# Patient Record
Sex: Female | Born: 1960 | Race: White | Hispanic: No | Marital: Married | Smoking: Current every day smoker
Health system: Southern US, Community
[De-identification: ages and names within clinical notes are randomized; demographics above are authoritative.]

## PROBLEM LIST (undated history)

## (undated) ENCOUNTER — Emergency Department (HOSPITAL_COMMUNITY): Payer: Self-pay | Source: Home / Self Care

## (undated) DIAGNOSIS — M1990 Unspecified osteoarthritis, unspecified site: Secondary | ICD-10-CM

## (undated) DIAGNOSIS — E785 Hyperlipidemia, unspecified: Secondary | ICD-10-CM

## (undated) DIAGNOSIS — F419 Anxiety disorder, unspecified: Secondary | ICD-10-CM

## (undated) DIAGNOSIS — K219 Gastro-esophageal reflux disease without esophagitis: Secondary | ICD-10-CM

## (undated) DIAGNOSIS — R7303 Prediabetes: Secondary | ICD-10-CM

## (undated) DIAGNOSIS — Z09 Encounter for follow-up examination after completed treatment for conditions other than malignant neoplasm: Secondary | ICD-10-CM

## (undated) DIAGNOSIS — F329 Major depressive disorder, single episode, unspecified: Secondary | ICD-10-CM

## (undated) DIAGNOSIS — F32A Depression, unspecified: Secondary | ICD-10-CM

## (undated) DIAGNOSIS — M25569 Pain in unspecified knee: Secondary | ICD-10-CM

## (undated) HISTORY — PX: ANKLE SURGERY: SHX546

## (undated) HISTORY — PX: KNEE SURGERY: SHX244

## (undated) HISTORY — PX: FRACTURE SURGERY: SHX138

## (undated) HISTORY — DX: Unspecified osteoarthritis, unspecified site: M19.90

## (undated) HISTORY — PX: TONSILLECTOMY: SUR1361

## (undated) HISTORY — DX: Hyperlipidemia, unspecified: E78.5

## (undated) HISTORY — DX: Gastro-esophageal reflux disease without esophagitis: K21.9

---

## 1998-02-16 ENCOUNTER — Emergency Department (HOSPITAL_COMMUNITY): Admission: EM | Admit: 1998-02-16 | Discharge: 1998-02-16 | Payer: Self-pay | Admitting: Emergency Medicine

## 1998-02-16 ENCOUNTER — Encounter: Payer: Self-pay | Admitting: Emergency Medicine

## 1998-06-22 ENCOUNTER — Ambulatory Visit (HOSPITAL_COMMUNITY): Admission: RE | Admit: 1998-06-22 | Discharge: 1998-06-22 | Payer: Self-pay

## 1998-06-28 ENCOUNTER — Ambulatory Visit (HOSPITAL_COMMUNITY): Admission: RE | Admit: 1998-06-28 | Discharge: 1998-06-28 | Payer: Self-pay

## 1998-08-30 ENCOUNTER — Encounter: Admission: RE | Admit: 1998-08-30 | Discharge: 1998-08-30 | Payer: Self-pay | Admitting: Obstetrics & Gynecology

## 1998-09-05 ENCOUNTER — Ambulatory Visit (HOSPITAL_COMMUNITY): Admission: RE | Admit: 1998-09-05 | Discharge: 1998-09-05 | Payer: Self-pay | Admitting: Obstetrics & Gynecology

## 1998-09-05 ENCOUNTER — Encounter: Payer: Self-pay | Admitting: Obstetrics & Gynecology

## 2000-01-03 ENCOUNTER — Other Ambulatory Visit: Admission: RE | Admit: 2000-01-03 | Discharge: 2000-01-03 | Payer: Self-pay | Admitting: *Deleted

## 2000-04-04 ENCOUNTER — Other Ambulatory Visit: Admission: RE | Admit: 2000-04-04 | Discharge: 2000-04-04 | Payer: Self-pay | Admitting: *Deleted

## 2000-06-20 ENCOUNTER — Other Ambulatory Visit: Admission: RE | Admit: 2000-06-20 | Discharge: 2000-06-20 | Payer: Self-pay | Admitting: *Deleted

## 2000-06-21 ENCOUNTER — Other Ambulatory Visit: Admission: RE | Admit: 2000-06-21 | Discharge: 2000-06-21 | Payer: Self-pay | Admitting: *Deleted

## 2000-06-21 ENCOUNTER — Encounter (INDEPENDENT_AMBULATORY_CARE_PROVIDER_SITE_OTHER): Payer: Self-pay | Admitting: Specialist

## 2000-09-10 ENCOUNTER — Other Ambulatory Visit: Admission: RE | Admit: 2000-09-10 | Discharge: 2000-09-10 | Payer: Self-pay | Admitting: *Deleted

## 2001-08-17 ENCOUNTER — Emergency Department (HOSPITAL_COMMUNITY): Admission: EM | Admit: 2001-08-17 | Discharge: 2001-08-17 | Payer: Self-pay | Admitting: Emergency Medicine

## 2001-09-19 ENCOUNTER — Emergency Department (HOSPITAL_COMMUNITY): Admission: EM | Admit: 2001-09-19 | Discharge: 2001-09-19 | Payer: Self-pay | Admitting: Emergency Medicine

## 2001-09-21 ENCOUNTER — Emergency Department (HOSPITAL_COMMUNITY): Admission: EM | Admit: 2001-09-21 | Discharge: 2001-09-21 | Payer: Self-pay

## 2002-09-19 ENCOUNTER — Encounter: Payer: Self-pay | Admitting: Emergency Medicine

## 2002-09-19 ENCOUNTER — Emergency Department (HOSPITAL_COMMUNITY): Admission: EM | Admit: 2002-09-19 | Discharge: 2002-09-19 | Payer: Self-pay | Admitting: Emergency Medicine

## 2002-12-09 ENCOUNTER — Encounter: Admission: RE | Admit: 2002-12-09 | Discharge: 2002-12-09 | Payer: Self-pay | Admitting: Internal Medicine

## 2003-04-29 ENCOUNTER — Inpatient Hospital Stay (HOSPITAL_COMMUNITY): Admission: EM | Admit: 2003-04-29 | Discharge: 2003-05-03 | Payer: Self-pay | Admitting: Emergency Medicine

## 2004-08-21 ENCOUNTER — Ambulatory Visit: Payer: Self-pay | Admitting: Nurse Practitioner

## 2005-01-18 ENCOUNTER — Ambulatory Visit: Payer: Self-pay | Admitting: Family Medicine

## 2005-01-20 ENCOUNTER — Ambulatory Visit (HOSPITAL_COMMUNITY): Admission: RE | Admit: 2005-01-20 | Discharge: 2005-01-20 | Payer: Self-pay | Admitting: Internal Medicine

## 2005-02-07 ENCOUNTER — Ambulatory Visit: Payer: Self-pay | Admitting: Nurse Practitioner

## 2005-02-10 ENCOUNTER — Ambulatory Visit (HOSPITAL_COMMUNITY): Admission: RE | Admit: 2005-02-10 | Discharge: 2005-02-10 | Payer: Self-pay | Admitting: Internal Medicine

## 2005-05-04 ENCOUNTER — Ambulatory Visit: Payer: Self-pay | Admitting: Nurse Practitioner

## 2005-07-26 ENCOUNTER — Ambulatory Visit: Payer: Self-pay | Admitting: Nurse Practitioner

## 2005-07-27 ENCOUNTER — Ambulatory Visit: Payer: Self-pay | Admitting: *Deleted

## 2005-10-18 ENCOUNTER — Ambulatory Visit: Payer: Self-pay | Admitting: Nurse Practitioner

## 2006-01-10 ENCOUNTER — Ambulatory Visit: Payer: Self-pay | Admitting: Internal Medicine

## 2006-03-18 ENCOUNTER — Ambulatory Visit: Payer: Self-pay | Admitting: Nurse Practitioner

## 2006-04-08 ENCOUNTER — Ambulatory Visit: Payer: Self-pay | Admitting: Nurse Practitioner

## 2006-09-26 ENCOUNTER — Ambulatory Visit: Payer: Self-pay | Admitting: Family Medicine

## 2006-09-26 ENCOUNTER — Encounter (INDEPENDENT_AMBULATORY_CARE_PROVIDER_SITE_OTHER): Payer: Self-pay | Admitting: Nurse Practitioner

## 2006-09-26 LAB — CONVERTED CEMR LAB
AST: 22 units/L (ref 0–37)
BUN: 13 mg/dL (ref 6–23)
Chloride: 103 meq/L (ref 96–112)
Glucose, Bld: 95 mg/dL (ref 70–99)
Hemoglobin: 14.9 g/dL (ref 12.0–15.0)
Lymphs Abs: 3 10*3/uL (ref 0.7–3.3)
Monocytes Relative: 10 % (ref 3–11)
Neutro Abs: 5 10*3/uL (ref 1.7–7.7)
Neutrophils Relative %: 55 % (ref 43–77)
Potassium: 4.3 meq/L (ref 3.5–5.3)
Sodium: 137 meq/L (ref 135–145)
TSH: 1.006 microintl units/mL (ref 0.350–5.50)
Total Protein: 7.6 g/dL (ref 6.0–8.3)

## 2006-10-02 ENCOUNTER — Encounter (INDEPENDENT_AMBULATORY_CARE_PROVIDER_SITE_OTHER): Payer: Self-pay | Admitting: *Deleted

## 2006-10-24 ENCOUNTER — Ambulatory Visit (HOSPITAL_COMMUNITY): Admission: RE | Admit: 2006-10-24 | Discharge: 2006-10-24 | Payer: Self-pay | Admitting: Family Medicine

## 2006-12-19 ENCOUNTER — Ambulatory Visit: Payer: Self-pay | Admitting: Family Medicine

## 2007-03-13 ENCOUNTER — Ambulatory Visit: Payer: Self-pay | Admitting: Internal Medicine

## 2007-06-04 ENCOUNTER — Ambulatory Visit: Payer: Self-pay | Admitting: Internal Medicine

## 2007-07-26 ENCOUNTER — Emergency Department (HOSPITAL_BASED_OUTPATIENT_CLINIC_OR_DEPARTMENT_OTHER): Admission: EM | Admit: 2007-07-26 | Discharge: 2007-07-26 | Payer: Self-pay | Admitting: Emergency Medicine

## 2007-08-06 ENCOUNTER — Inpatient Hospital Stay (HOSPITAL_COMMUNITY): Admission: RE | Admit: 2007-08-06 | Discharge: 2007-08-08 | Payer: Self-pay | Admitting: Orthopedic Surgery

## 2007-08-11 ENCOUNTER — Ambulatory Visit: Payer: Self-pay | Admitting: Internal Medicine

## 2007-08-11 LAB — CONVERTED CEMR LAB
INR: 2 — ABNORMAL HIGH (ref 0.0–1.5)
Prothrombin Time: 23.6 s — ABNORMAL HIGH (ref 11.6–15.2)

## 2007-08-14 ENCOUNTER — Ambulatory Visit: Payer: Self-pay | Admitting: Internal Medicine

## 2007-08-14 LAB — CONVERTED CEMR LAB
INR: 3.3 — ABNORMAL HIGH (ref 0.0–1.5)
Prothrombin Time: 36.4 s — ABNORMAL HIGH (ref 11.6–15.2)

## 2007-08-18 ENCOUNTER — Ambulatory Visit: Payer: Self-pay | Admitting: Internal Medicine

## 2007-08-18 LAB — CONVERTED CEMR LAB: INR: 4.6 — ABNORMAL HIGH (ref 0.0–1.5)

## 2007-08-21 ENCOUNTER — Ambulatory Visit: Payer: Self-pay | Admitting: Internal Medicine

## 2007-08-26 ENCOUNTER — Ambulatory Visit: Payer: Self-pay | Admitting: Internal Medicine

## 2010-05-17 ENCOUNTER — Emergency Department (HOSPITAL_COMMUNITY): Payer: Self-pay

## 2010-05-17 ENCOUNTER — Emergency Department (HOSPITAL_COMMUNITY)
Admission: EM | Admit: 2010-05-17 | Discharge: 2010-05-17 | Disposition: A | Discharge status: Home / Self Care | Payer: Self-pay | Attending: Emergency Medicine | Admitting: Emergency Medicine

## 2010-05-17 DIAGNOSIS — M25569 Pain in unspecified knee: Secondary | ICD-10-CM | POA: Insufficient documentation

## 2010-05-30 NOTE — Op Note (Signed)
NAME:  [name], [name] NO.:  [id number]   MEDICAL RECORD NO.:  [id number]          PATIENT TYPE:  OIB   LOCATION:  1314                         FACILITY:  [facility]   PHYSICIAN:  Ollen Gross, M.D.    DATE OF BIRTH:  11/08/1960   DATE OF PROCEDURE:  08/06/2007  DATE OF DISCHARGE:                               OPERATIVE REPORT   PREOPERATIVE DIAGNOSIS:  Right bimalleolar ankle fracture.   POSTOPERATIVE DIAGNOSIS:  Right bimalleolar ankle fracture.   PROCEDURE:  Open reduction internal fixation right ankle fracture.   SURGEON:  Ollen Gross, M.D.   ASSISTANT:  Avel Peace, P.A.-C.   ANESTHESIA:  General.   ESTIMATED BLOOD LOSS:  Minimal.   DRAINS:  None.   TOURNIQUET TIME:  44 minutes at 300 mmHg.   COMPLICATIONS:  None.   CONDITION:  Stable to recovery.   BRIEF CLINICAL NOTE:  Jasmine Santos is a 50 year old female who had a bimalleolar  ankle fracture sustained approximately a week ago.  She had some slight  displacement of both the medial and lateral malleoli.  She presents now  for open reduction and internal fixation.   PROCEDURE IN DETAIL:  After the successful administration of general  anesthetic, a tourniquet was placed high on the right thigh and right  lower extremity prepped and draped in the usual sterile fashion.  Extremities wrapped in Esmarch.  Tourniquet inflated to 300 mmHg.  Lateral incision was made first over the fibula.  Skin was cut with a 10  blade through subcutaneous tissue to the periosteum.  Fracture was  identified and anatomically reduced and held with the fracture induction  clamp.  Seven-hole 1/3 tubular plate was placed.  I placed 1 screw  cancellus distal to the fracture, 1 cortical to the fracture, and then  released the clamp.  The fracture stayed reduced.  We placed 2 more  cancellus screws distal to the fracture, 2 more cortical screws  proximal.  The top hole was not filled as it was partially anterior to  the bone, but  the rest of screws had excellent purchase.  The  syndesmosis was tested under fluoroscopy and found to be intact.  I made  a medial vertical incision over the medial malleolus.  The fracture line  was identified and reduced.  The guide pins for the 4.0 cannulated  cancellous screws were then passed from the tip of malleolus up into the  tibia.  The medial malleolus appears anatomically reduced.  Two 50-mm  cancellus cannulated screws were then passed over the guide pins, and  excellent purchase was achieved in the bone.  The guide pins were then  removed.  We then copiously irrigated both incisions with saline.  I  repaired the retinaculum  medially.  The subcutaneous was then closed with interrupted 2-0 Vicryl  and skin with staples on both incisions.  A bulky sterile dressing was  applied, and she was placed into a Cam walker, awakened and transferred  to recovery in stable condition.      Ollen Gross, M.D.  Electronically Signed     FA/MEDQ  D:  08/06/2007  T:  08/06/2007  Job:  [id number]

## 2010-06-02 NOTE — Group Therapy Note (Signed)
NAME:  Jasmine Santos, Jasmine Santos NO.:  [id number]   MEDICAL RECORD NO.:  [id number]                   PATIENT TYPE:  INP   LOCATION:  IC07                                 FACILITY:  [facility]   PHYSICIAN:  Vania Rea, M.D.              DATE OF BIRTH:  1960-12-17   DATE OF PROCEDURE:  05/01/2003  DATE OF DISCHARGE:                                   PROGRESS NOTE   SUBJECTIVE:  A 50 year old lady admitted with overdose of between 40 to 90  tablets of Effexor, admitted with a long QT on the EKG with recurrent  seizure and found to have cocaine urine and alcohol level of over 140 many  hours after admission.  This morning the patient is confused about where she  is and why she is here.  She was very agitated last night, and she continues  to receive Ativan around the clock.  Her p.o. intake is described as  minimal, but she is eating.   She has been evaluated by the ACT team.  I have signed her committal papers  and once she is medically stable, she will be transferred to a behavioral  health facility.   OBJECTIVE:  VITAL SIGNS:  Her vitals this morning:  Temperature 97.5, pulse  81, respirations 21, and blood pressure 110/70.  She is saturating at 98%.  HEENT:  Pupils are round and equal and reactive to light.  CHEST:  She has occasional crackles in the bases bilaterally.  CARDIOVASCULAR:  Regular rhythm, no gallops, no murmurs.  ABDOMEN:  Obese, soft, and nontender.  She has normal abdominal bowel  sounds.  EXTREMITIES:  No edema.  Scars of self-mutilation are noticed.   Her labs this morning:  Her chemistry is essentially normal except for a  glucose of 102.  Her BUN is 3 and her creatinine is 0.6.  Calcium is 8.9.  The potassium is 4.2.  Her white count is 11.1, which is down from 17.  She  remains without antibiotics.  Hematocrit is 39.6, RDW 13, and platelet count  260, 70% neutrophils.  Absolute neutrophil count is 7.8, which is just  normal.  Phosphorus  and magnesium are normal.   Her chest x-ray shows mild pulmonary edema.  She is receiving IV fluids at  200 mL/hr. Still.   ASSESSMENT:  1. Selective serotonin reuptake inhibitor overdose.  2. Severe depression.  3. History of bipolar disorder.  4. Ethanol abuse.  5. Tobacco abuse.  6. Fluid overload.  7. Confusional state.  8. History of recurrent seizure secondary to drug overdose.   PLAN:  1. We will heparin-lock her IV.  2. Will continue on Ativan and start weaning when signs of agitation are     diminished.  3. Although her QT on her EKG is now normalized and she does have a history     of depression, we will not  be giving any psychotropics at this point.  4. She will otherwise receive supportive care.  5. She may, in fact, be going through alcohol withdrawal with his new     agitation __________.      ___________________________________________                                            Vania Rea, M.D.   LC/MEDQ  D:  05/01/2003  T:  05/01/2003  Job:  [id number]

## 2010-06-02 NOTE — H&P (Signed)
NAME:  Jasmine Santos, Jasmine Santos NO.:  [id number]   MEDICAL RECORD NO.:  [id number]                   PATIENT TYPE:  INP   LOCATION:  IC07                                 FACILITY:  [facility]   PHYSICIAN:  Vania Rea, M.D.              DATE OF BIRTH:  10-18-1960   DATE OF ADMISSION:  04/29/2003  DATE OF DISCHARGE:                                HISTORY & PHYSICAL   PRIMARY CARE PHYSICIAN:  Unassigned.   CHIEF COMPLAINT:  Recurrent seizures after Effexor overdose.   HISTORY OF PRESENT ILLNESS:  This lady is unable to give a clear history  because of what has transpired in the past 24 hours.   This is a 50 year old Caucasian lady who recently moved into the [place]  area from [place]. She says for reasons which are unclear she took four  to five tablets of Effexor yesterday. There were 90 tablets in the bottle,  she says. She just decided to take 45. She occasionally takes Tylenol PM to  help her to sleep, but she has not taken any for the past two weeks she  said. The patient also abuses alcohol, drinks six pack daily for many years,  and smokes tobacco one pack per day for many years. Does not admit illicit  drug use. After taking these tablets, the patient later had seizures at  home, and her live-in boyfriend brought her to the emergency room. In the  emergency room, the patient has had two further seizures but is not alert  although somewhat confused.   PAST MEDICAL HISTORY:  1. Depression.  2. ETOH abuse.  3. Tobacco abuse.   MEDICATIONS:  1. Effexor dose unknown.  2. Tylenol PM occasionally for insomnia.   ALLERGIES:  No known drug allergies.   SOCIAL HISTORY:  The patient says she works in an [employer] development [employer],  unclear where. She waivers in her answers. She does admit to chronic tobacco  and alcohol abuse. She has never been married. Has no children. Lives with  her boyfriend.   FAMILY HISTORY:  Unable to get a clear and reliable  family history from her.   REVIEW OF SYSTEMS:  Denies headache currently but since taking the Effexor  says she has been having blurred vision. Denies chest pain, palpitations, or  shortness of breath. Denies nausea, vomiting, or abdominal pain. Denies  frequency, dysuria, hematuria. Denies any musculoskeletal problems. Denies  any past history of strokes. Denies any past history of hospital admissions.   PHYSICAL EXAMINATION:  GENERAL:  This is a young Caucasian lady sitting up  on the stretcher, looking somewhat confused, complaining of thirst. She has  an obvious abrasion on the right side of her forehead as a result of trauma  during her seizures.  VITAL SIGNS:  Temperature is not available. Blood pressure is 154/40, pulse  92, respirations 20. She is saturating 95% on room air.  HEENT:  Pupils are equal, widely dilated but reactive to light. She is pink  and anicteric. There is no jugular venous distention. No lymphadenopathy. No  thyromegaly.  CHEST:  Clear to auscultation bilaterally.  CARDIOVASCULAR:  Regular rhythm, no murmurs.  ABDOMEN:  Soft and nontender.  EXTREMITIES:  There is no edema. There are many old bruises. Dorsalis pedis  pulses are 3+ bilaterally.  CENTRAL NERVOUS SYSTEM:  There is no obvious neurological deficit. She is  alert and oriented to herself and her location.   LABORATORY DATA:  White count is 12.8 with a hematocrit of 40.7 and  neutrophil count of 74 and a platelet count of 327. Serum sodium is entirely  normal. Potassium is 3.5, creatinine 0.8. Her liver function tests are  completely normal. Albumin is 4.3, total bilirubin 0.5, alkaline phosphatase  is 56. Tylenol level undetectable. Salicylate levels are undetectable. Urine  pregnancy test is negative. Urinalysis shows a specific gravity of 1.030 and  a trace of ketones, otherwise unremarkable. Urine drug screen is positive  for cocaine, negative for all the other tested substances. Her alcohol  level  at 11:15 a.m. is 143 ____________ much higher last night when she was  overdosing herself with these medications.   ASSESSMENT:  1. Effexor overdose with sinus tachycardia and long QT on EKG.  2. Seizure disorder, probably secondary to Effexor overdose, possibly     related to ETOH intoxication and possibly a combination of both.  3. Dehydration.   PLAN:  Will admit to the intensive care unit. Will hydrate her. Will monitor  her serum chemistry. Will put on her an Ativan withdraw protocol which will  treat both her seizures and will prevent any ETOH withdraw. Will monitor EKG  once she is stable. Get an ACT team consult to assess her mental status.  Will get her records from [facility] in [place] where she is usually  treated.     ___________________________________________                                         Vania Rea, M.D.   LC/MEDQ  D:  04/29/2003  T:  04/29/2003  Job:  [id number]

## 2010-06-02 NOTE — Discharge Summary (Signed)
NAME:  Jasmine Santos, Jasmine Santos NO.:  [id number]   MEDICAL RECORD NO.:  [id number]                   PATIENT TYPE:  INP   LOCATION:  IC07                                 FACILITY:  [facility]   PHYSICIAN:  Vania Rea, M.D.              DATE OF BIRTH:  [birth year]   DATE OF ADMISSION:  04/29/2003  DATE OF DISCHARGE:  05/03/2003                                 DISCHARGE SUMMARY   PRIMARY CARE PHYSICIAN:  Unassigned.  Gets care from [facility].   DISCHARGE DIAGNOSES:  1. Severe depression.  2. Effexor overdose.  3. Alcohol intoxication.  4. Alcohol withdrawal, resolved.  5. Tobacco abuse.  6. Status epilepticus, status second __________ one and two.   CONDITION ON DISCHARGE:  Stable.   DISPOSITION:  The patient is planned for discharge to [facility]  facility.   DISCHARGE MEDICATIONS:  1. Ativan 4 mg p.o. four times daily, tapering over four to five days.  2. Multivitamins, one tablet daily.  3. Thiamine 100 mg daily.  4. Folic acid 1 mg daily.   HOSPITAL COURSE:  Please refer to admission history and physical of April  14th.  The patient is a 50 year old Caucasian lady with history of cocaine,  tobacco, and alcohol abuse who in a drunken stupor in a state of depression  overdosed on about 88 Effexor tablets, 37.5 mg strength.  She had a seizure  at home and was brought into the emergency room.  She had recurrent seizures  in the emergency room.  She was loaded with Ativan and admitted to the  intensive care.  The patient was noted to have long QT while on the monitor.  She was confused and hallucinogenic for about three days while in the  intensive care unit but now is pretty close to her baseline and stable.  She  had no derangements of her liver functions.  Her serum chemistry and  hematology remain normal.  She was aggressively hydrated from the time of  admission and did develop fluid overload which spontaneously corrected when  her IV  fluids were discontinued two days ago.  The patient required wrist  restraints and Ativan 4 mg IV every two hours for the first two days of  admission in order to suppress seizures and to suppress her agitation.  Gradually, the frequency of intravenous Ativan has been reduced.   The patient also has a history of self-mutilation according to her family,  and there are multiple linear abrasions on her feet suggestive of knife  marks.   LABORATORY DATA:  Today, her white count is 8.9, hemoglobin 14.9, platelets  300.  Her phosphorous is 3.5, and her magnesium is 2.0.  Her serum chemistry  done yesterday was completely normal.  The LFTs done yesterday were  completely normal.  On admission, her serum alcohol done about 12 hours  after arrival was 143.  Her salicylate  and acetaminophen were negative.  Urine toxicity screen was positive for cocaine.  Pregnancy test was  negative.   FOLLOW UP:  The patient is to be followed up by the psychiatrist at the  mental health facility.     ___________________________________________                                         Vania Rea, M.D.   LC/MEDQ  D:  05/03/2003  T:  05/03/2003  Job:  [id number]

## 2010-06-02 NOTE — Group Therapy Note (Signed)
NAME:  Jasmine Santos, BUTTERMORE NO.:  [id number]   MEDICAL RECORD NO.:  [id number]                   PATIENT TYPE:  INP   LOCATION:  IC07                                 FACILITY:  [facility]   PHYSICIAN:  Vania Rea, M.D.              DATE OF BIRTH:  [birth year]   DATE OF PROCEDURE:  DATE OF DISCHARGE:                                   PROGRESS NOTE   COMMENT:  A 50 year old lady admitted after overdosing on 88 tablets of  Effexor 37.5 mg each, also had cocaine on board.  Also had alcohol on board.  This is her third hospital day.  The patient has been through periods of  severe agitation and hallucination.  Last night, she was very agitated and  has not gotten much sleep thi morning.  She has required less Ativan and is  more lucid, but is still somewhat confused, but is being continuously  oriented.  We are now seeing signs of progress with her, and she may only be  here maybe another 24 hours.   SUBJECTIVE:  No complaints.   OBJECTIVE:  Temperature 97.2, pulse 100, respirations 21, blood pressure  112/87.  Saturating at 91%.  Her chest is clear to auscultation bilaterally  with no evidence of fluid overload.  Abdomen is soft and nontender.  Her  extremities, she has no edema.  She has 3+ pulses.  Lab work:  Liver  function tests are completely normal, and I think we can now discontinue  these tests if phosphorus is normal.  Magnesium is normal.  Her serum  chemistry:  Sodium 136, potassium 3.8, chloride 104, cO2 23, glucose 113,  BUN 7, creatinine 0.7, and calcium 9.3.  Her CBC:  White count 11.6 which is  still trending down.  Neutrophil count is 6.7.  Absolute count is 7.8 which  is unchanged from yesterday, and this is borderline normal.  We will call  this normal.   ASSESSMENT/PLAN:  Acute drug overdose with SRI's concomitant with alcohol  intoxication and cocaine abuse.  Recurrent seizures, status epilepticus on  admission.  No seizure since arrival  in the intensive care unit.  The  patient maybe sufficiency stable for transfer to the Behavior Health  Facility in the morning.      ___________________________________________                                            Vania Rea, M.D.   LC/MEDQ  D:  05/02/2003  T:  05/02/2003  Job:  [id number]

## 2010-07-20 ENCOUNTER — Other Ambulatory Visit: Payer: Self-pay | Admitting: Obstetrics & Gynecology

## 2010-07-20 ENCOUNTER — Other Ambulatory Visit (HOSPITAL_COMMUNITY)
Admission: RE | Admit: 2010-07-20 | Discharge: 2010-07-20 | Disposition: A | Discharge status: Home / Self Care | Payer: Medicaid Other | Source: Ambulatory Visit | Attending: Obstetrics & Gynecology | Admitting: Obstetrics & Gynecology

## 2010-07-20 DIAGNOSIS — Z113 Encounter for screening for infections with a predominantly sexual mode of transmission: Secondary | ICD-10-CM | POA: Insufficient documentation

## 2010-07-20 DIAGNOSIS — Z01419 Encounter for gynecological examination (general) (routine) without abnormal findings: Secondary | ICD-10-CM | POA: Insufficient documentation

## 2010-10-13 LAB — URINALYSIS, ROUTINE W REFLEX MICROSCOPIC
Bilirubin Urine: NEGATIVE
Glucose, UA: NEGATIVE
Hgb urine dipstick: NEGATIVE
Ketones, ur: NEGATIVE
Nitrite: NEGATIVE
Specific Gravity, Urine: 1.025
pH: 5

## 2010-10-13 LAB — BASIC METABOLIC PANEL
BUN: 11
CO2: 27
Calcium: 9.7
Glucose, Bld: 105 — ABNORMAL HIGH
Sodium: 138

## 2010-10-13 LAB — URINE MICROSCOPIC-ADD ON

## 2010-10-13 LAB — CBC
HCT: 42
MCHC: 34.4
MCV: 93.7
RBC: 4.48
WBC: 8.9

## 2010-10-13 LAB — PROTIME-INR
INR: 1
Prothrombin Time: 13.8

## 2010-10-13 LAB — PREGNANCY, URINE: Preg Test, Ur: NEGATIVE

## 2010-11-24 ENCOUNTER — Emergency Department (HOSPITAL_COMMUNITY): Payer: Medicaid Other

## 2010-11-24 ENCOUNTER — Emergency Department (HOSPITAL_COMMUNITY)
Admission: EM | Admit: 2010-11-24 | Discharge: 2010-11-24 | Disposition: A | Discharge status: Home / Self Care | Payer: Medicaid Other | Attending: Emergency Medicine | Admitting: Emergency Medicine

## 2010-11-24 ENCOUNTER — Encounter: Payer: Self-pay | Admitting: *Deleted

## 2010-11-24 DIAGNOSIS — F17200 Nicotine dependence, unspecified, uncomplicated: Secondary | ICD-10-CM | POA: Insufficient documentation

## 2010-11-24 DIAGNOSIS — M25569 Pain in unspecified knee: Secondary | ICD-10-CM | POA: Insufficient documentation

## 2010-11-24 DIAGNOSIS — S83519A Sprain of anterior cruciate ligament of unspecified knee, initial encounter: Secondary | ICD-10-CM

## 2010-11-24 DIAGNOSIS — S83202A Bucket-handle tear of unspecified meniscus, current injury, unspecified knee, initial encounter: Secondary | ICD-10-CM

## 2010-11-24 HISTORY — DX: Major depressive disorder, single episode, unspecified: F32.9

## 2010-11-24 HISTORY — DX: Depression, unspecified: F32.A

## 2010-11-24 HISTORY — DX: Anxiety disorder, unspecified: F41.9

## 2010-11-24 NOTE — ED Notes (Signed)
Pt needed referral for MRI due to knee giving out. Old ACL injury to right knee also. Pain x 6 mo. Dr. Sanjuan Dame office told pt to come to ED for referral.

## 2010-11-24 NOTE — ED Notes (Signed)
Pt a/ox4. Resp even and unlabored. NAD at this time. D/C instructions reviewed with pt. Pt verbalized understanding. Pt to POV via w/c and walker. Pt stable.

## 2010-11-24 NOTE — ED Notes (Signed)
Knee immobilizer applied to right leg

## 2010-11-24 NOTE — ED Provider Notes (Signed)
History     CSN: [id number] Arrival date & time: 11/24/2010 12:37 PM   First MD Initiated Contact with Patient 11/24/10 1307      Chief Complaint  Patient presents with  . Knee Pain    (Consider location/radiation/quality/duration/timing/severity/associated sxs/prior treatment) HPI Comments: Pt states she had an ACL tear many years ago that has gone unrepaired.  More and more frequently she notes that with certain movements she has  Pain and the instability  Frequently causes her to fall down.  She says that she has talked to "betty  Here at [facility] that told her she will probably need an MRI that  [facility]  Could help with and the same with an orthopedic referral".  Patient is a 50 y.o. female presenting with knee pain. The history is provided by the patient. No language interpreter was used.  Knee Pain This is a chronic problem. The problem occurs constantly. The symptoms are aggravated by walking and standing. She has tried nothing for the symptoms.    Past Medical History  Diagnosis Date  . Anxiety   . Depression     Past Surgical History  Procedure Date  . Knee surgery   . Ankle surgery   . Tonsillectomy     No family history on file.  History  Substance Use Topics  . Smoking status: Current Everyday Smoker  . Smokeless tobacco: Not on file  . Alcohol Use: Yes     OCC    OB History    Grav Para Term Preterm Abortions TAB SAB Ect Mult Living                  Review of Systems  Musculoskeletal: Positive for gait problem.       Knee pain  All other systems reviewed and are negative.    Allergies  Review of patient's allergies indicates no known allergies.  Home Medications  No current outpatient prescriptions on file.  BP 128/93  Pulse 97  Temp(Src) 98.4 F (36.9 C) (Oral)  Resp 18  Ht 5\' 4"  (1.626 m)  Wt 170 lb (77.111 kg)  BMI 29.18 kg/m2  SpO2 98%  Physical Exam  Nursing note and vitals reviewed. Constitutional: She is oriented to person, place,  and time. She appears well-developed and well-nourished. No distress.  HENT:  Head: Normocephalic and atraumatic.  Eyes: EOM are normal.  Neck: Normal range of motion.  Cardiovascular: Normal rate, regular rhythm and normal heart sounds.   Pulmonary/Chest: Effort normal and breath sounds normal.  Abdominal: Soft. She exhibits no distension. There is no tenderness.  Musculoskeletal: She exhibits tenderness.       Right knee: She exhibits decreased range of motion. She exhibits no swelling, no effusion, no ecchymosis, no deformity, no laceration, no erythema, normal alignment, no LCL laxity and normal patellar mobility.       Legs: Neurological: She is alert and oriented to person, place, and time.  Skin: Skin is warm and dry.  Psychiatric: She has a normal mood and affect. Judgment normal.    ED Course  Procedures (including critical care time)  Labs Reviewed - No data to display No results found.   No diagnosis found.    MDM          Worthy Rancher, PA 11/26/10 [phone]

## 2010-11-27 NOTE — ED Provider Notes (Signed)
Medical screening examination/treatment/procedure(s) were performed by non-physician practitioner and as supervising physician I was immediately available for consultation/collaboration.  Dartanyon Frankowski S. Michille Mcelrath, MD 11/27/10 1755 

## 2011-09-24 ENCOUNTER — Emergency Department (HOSPITAL_COMMUNITY)
Admission: EM | Admit: 2011-09-24 | Discharge: 2011-09-24 | Disposition: A | Discharge status: Home / Self Care | Payer: BC Managed Care – PPO | Attending: Emergency Medicine | Admitting: Emergency Medicine

## 2011-09-24 ENCOUNTER — Inpatient Hospital Stay (HOSPITAL_COMMUNITY): Payer: BC Managed Care – PPO

## 2011-09-24 ENCOUNTER — Encounter (HOSPITAL_COMMUNITY): Payer: Self-pay | Admitting: *Deleted

## 2011-09-24 DIAGNOSIS — F17200 Nicotine dependence, unspecified, uncomplicated: Secondary | ICD-10-CM | POA: Insufficient documentation

## 2011-09-24 DIAGNOSIS — F411 Generalized anxiety disorder: Secondary | ICD-10-CM | POA: Insufficient documentation

## 2011-09-24 DIAGNOSIS — T5191XA Toxic effect of unspecified alcohol, accidental (unintentional), initial encounter: Secondary | ICD-10-CM | POA: Insufficient documentation

## 2011-09-24 DIAGNOSIS — T7411XA Adult physical abuse, confirmed, initial encounter: Secondary | ICD-10-CM | POA: Insufficient documentation

## 2011-09-24 DIAGNOSIS — T43694A Poisoning by other psychostimulants, undetermined, initial encounter: Secondary | ICD-10-CM | POA: Insufficient documentation

## 2011-09-24 DIAGNOSIS — T43502A Poisoning by unspecified antipsychotics and neuroleptics, intentional self-harm, initial encounter: Secondary | ICD-10-CM | POA: Insufficient documentation

## 2011-09-24 DIAGNOSIS — R4182 Altered mental status, unspecified: Secondary | ICD-10-CM | POA: Insufficient documentation

## 2011-09-24 DIAGNOSIS — F329 Major depressive disorder, single episode, unspecified: Secondary | ICD-10-CM | POA: Insufficient documentation

## 2011-09-24 DIAGNOSIS — F3289 Other specified depressive episodes: Secondary | ICD-10-CM | POA: Insufficient documentation

## 2011-09-24 DIAGNOSIS — Y92009 Unspecified place in unspecified non-institutional (private) residence as the place of occurrence of the external cause: Secondary | ICD-10-CM | POA: Insufficient documentation

## 2011-09-24 DIAGNOSIS — F1010 Alcohol abuse, uncomplicated: Secondary | ICD-10-CM

## 2011-09-24 DIAGNOSIS — IMO0002 Reserved for concepts with insufficient information to code with codable children: Secondary | ICD-10-CM | POA: Insufficient documentation

## 2011-09-24 DIAGNOSIS — T6592XA Toxic effect of unspecified substance, intentional self-harm, initial encounter: Secondary | ICD-10-CM | POA: Insufficient documentation

## 2011-09-24 DIAGNOSIS — T7492XA Unspecified child maltreatment, confirmed, initial encounter: Secondary | ICD-10-CM | POA: Insufficient documentation

## 2011-09-24 DIAGNOSIS — T43501A Poisoning by unspecified antipsychotics and neuroleptics, accidental (unintentional), initial encounter: Secondary | ICD-10-CM | POA: Insufficient documentation

## 2011-09-24 DIAGNOSIS — T7491XA Unspecified adult maltreatment, confirmed, initial encounter: Secondary | ICD-10-CM | POA: Insufficient documentation

## 2011-09-24 LAB — RAPID URINE DRUG SCREEN, HOSP PERFORMED
Amphetamines: NOT DETECTED
Benzodiazepines: NOT DETECTED
Opiates: NOT DETECTED

## 2011-09-24 LAB — CBC WITH DIFFERENTIAL/PLATELET
Basophils Absolute: 0.1 10*3/uL (ref 0.0–0.1)
HCT: 44.2 % (ref 36.0–46.0)
Hemoglobin: 15.8 g/dL — ABNORMAL HIGH (ref 12.0–15.0)
Lymphocytes Relative: 39 % (ref 12–46)
Monocytes Absolute: 0.6 10*3/uL (ref 0.1–1.0)
Neutro Abs: 4.7 10*3/uL (ref 1.7–7.7)
RBC: 4.81 MIL/uL (ref 3.87–5.11)
RDW: 12.2 % (ref 11.5–15.5)
WBC: 8.9 10*3/uL (ref 4.0–10.5)

## 2011-09-24 LAB — BASIC METABOLIC PANEL
CO2: 24 mEq/L (ref 19–32)
Chloride: 103 mEq/L (ref 96–112)
Creatinine, Ser: 0.89 mg/dL (ref 0.50–1.10)

## 2011-09-24 LAB — SALICYLATE LEVEL: Salicylate Lvl: <2 mg/dL — ABNORMAL LOW (ref 2.8–20.0)

## 2011-09-24 LAB — ETHANOL
Alcohol, Ethyl (B): 270 mg/dL — ABNORMAL HIGH (ref 0–11)
Alcohol, Ethyl (B): <11 mg/dL (ref 0–11)

## 2011-09-24 MED ORDER — SODIUM CHLORIDE 0.9 % IV BOLUS (SEPSIS)
1000.0000 mL | Freq: Once | INTRAVENOUS | Status: AC
  Administered 2011-09-24: 1000 mL via INTRAVENOUS

## 2011-09-24 MED ORDER — LORAZEPAM 1 MG PO TABS
1.0000 mg | ORAL_TABLET | Freq: Once | ORAL | Status: AC
  Administered 2011-09-24: 1 mg via ORAL
  Filled 2011-09-24: qty 1

## 2011-09-24 NOTE — ED Provider Notes (Signed)
Assumed care in signout Pt now more awake/alert Denies SI.  She reports she feels safe at home and wants to go home to her husband She was seen by telepsych.  telepsych feels she is safe for detox and otherwise does not need inpatient admission She refuses detox.  She will be seen by ACT and sent home.  CXR ordered for mild hypoxia that resolved. ekg reviewed.  Labs reviewed.  One dose of ativan ordered.  Pt is taking PO Vitals improved BP 118/84  Pulse 97  Temp 97.9 F (36.6 C) (Oral)  Resp 20  SpO2 97%   Joya Gaskins, MD 09/24/11 1914

## 2011-09-24 NOTE — ED Notes (Signed)
Spoke with Corrie Dandy at [facility] re: pt Seroquel overdose.  Advises EKG, cardiac monitoring, tylenol and salicylate levels, Mg++ level, K+; watch for hypotension, prolonged QRS and QT; advises do not give Geodon or Haldol for agitation.

## 2011-09-24 NOTE — ED Notes (Signed)
Placed on cardiac monitor, BP monitor and continuous pulse ox.

## 2011-09-24 NOTE — ED Notes (Signed)
Pt reporting she has spoken with ACT team, and had telepsych consult.  Pt to be discharged home per physician.  Denies any needs at present.  No distress noted.  Pt understands discharge instructions and need to follow up.

## 2011-09-24 NOTE — ED Notes (Signed)
RA SpO2 90%; placed on O2 2L/min per Nikolai; NS 1L bolus initiated.

## 2011-09-24 NOTE — ED Notes (Signed)
Meal tray given 

## 2011-09-24 NOTE — ED Notes (Signed)
Pt on consult with telepsych psychiatrist.

## 2011-09-24 NOTE — ED Notes (Addendum)
Wrong pt
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
## 2011-09-24 NOTE — ED Notes (Signed)
Pt arrived from home via ems d/t drug overdose. Pt reportedly took 11 Seroquel 100mg  and an unknown number of Aderol.
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
## 2011-09-26 ENCOUNTER — Ambulatory Visit: Payer: Medicaid Other | Admitting: Orthopedic Surgery

## 2011-10-16 ENCOUNTER — Ambulatory Visit (INDEPENDENT_AMBULATORY_CARE_PROVIDER_SITE_OTHER): Payer: BC Managed Care – PPO | Admitting: Orthopedic Surgery

## 2011-10-16 ENCOUNTER — Encounter: Payer: Self-pay | Admitting: Orthopedic Surgery

## 2011-10-16 VITALS — BP 120/72 | Ht 64.0 in | Wt 170.0 lb

## 2011-10-16 DIAGNOSIS — M238X9 Other internal derangements of unspecified knee: Secondary | ICD-10-CM

## 2011-10-16 DIAGNOSIS — M2390 Unspecified internal derangement of unspecified knee: Secondary | ICD-10-CM

## 2011-10-16 DIAGNOSIS — M23205 Derangement of unspecified medial meniscus due to old tear or injury, unspecified knee: Secondary | ICD-10-CM

## 2011-10-16 HISTORY — DX: Derangement of unspecified medial meniscus due to old tear or injury, unspecified knee: M23.205

## 2011-10-16 HISTORY — DX: Other internal derangements of unspecified knee: M23.8X9

## 2011-10-16 NOTE — Addendum Note (Signed)
Addended by: Adella Hare B on: 10/16/2011 12:21 PM   Modules accepted: Orders

## 2011-10-16 NOTE — Progress Notes (Signed)
Patient ID: Jasmine Santos, female   DOB: [birth year], 51 y.o.   MRN: [id number] Chief Complaint  Patient presents with  . Knee Pain    right knee pain, popping and cracking, and swelling x 3 weeks, injured 05/27/10    History this is a 51 year old female, who injured her ACL in 2007 when she jumped after running downhill. She felt a pop in her knee or leg brace for a small period of time and then went back to her normal activities, although she has frequent giving out episodes. She did well until May of 2000 512 she was in bed. She pivoted on her RIGHT foot, twisting her RIGHT knee, felt another pop and a 2nd MRI, which showed an old ACL tear with a new bucket-handle medial meniscal tear. She didn't have insurance at that time and was unable to be seen until now. She had a RIGHT ankle O., TIF she's had LEFT foot metatarsal fracture fixation.  She presents now with pain and swelling, giving out symptoms in the RIGHT knee requiring her to use a cane. She complains of locking, catching, and giving out episode with swelling as well. There is some dull burning, pain in the knee goes out of place. Her pain ranges from 2-9/10.  Referral has been made to Dr. Katha Hamming  She listed the following for positive review of systems. Weight gain and fatigue heart murmur shortness of breath, wheezing and cough, heartburn, nausea, diarrhea, joint pain joint swelling, instability, stiffness, muscle pain, numbness, tingling in the ulnar nerve, nervousness, anxiety, and depression with sinus problems. All others are negative.  Medical history as recorded.   The patient's allergies are recorded, the medical and surgical history have been recorded, medications family history and social history have been recorded and all have been reviewed.  Vital signs are stable as recorded  General appearance is normal  The patient is alert and oriented x3  The patient's mood and affect are normal  Gait assessment: Abnormal gait  pattern requiring the use of a cane. She has a limp The cardiovascular exam reveals normal pulses and temperature without edema swelling.  The lymphatic system is negative for palpable lymph nodes  The sensory exam is normal.  There are no pathologic reflexes.  Balance is normal.   Exam of the RIGHT knee joint effusion, medial joint line tenderness, 10 block to extension, flexion, 120. I did not detect Lockman positive time. Strength normal skin intact.  Upper extremity exam  Inspection and palpation revealed no abnormalities in the upper extremities.  Range of motion is full without contracture.  Motor exam is normal with grade 5 strength.  The joints are fully reduced without subluxation.  There is no atrophy or tremor and muscle tone is normal.  All joints are stable.  The opposite LEFT knee. Range of motion, strength, stability and alignment are intact. No swelling or tenderness  MRI and x-rays were done. MRI shows a bucket-handle tear medial meniscus with old ACL tear. The MRI from 2007 shows ACL tear.  ACL tear, RIGHT knee Medial meniscal bucket-handle tear, RIGHT knee  Plan arthroscopy, RIGHT knee, partial medial meniscectomy, and then 6 weeks of rehabilitation

## 2011-10-16 NOTE — Patient Instructions (Addendum)
Arthroscopic Procedure, Knee An arthroscopic procedure can find what is wrong with your knee. PROCEDURE Arthroscopy is a surgical technique that allows your orthopedic surgeon to diagnose and treat your knee injury with accuracy. They will look into your knee through a small instrument. This is almost like a small (pencil sized) telescope. Because arthroscopy affects your knee less than open knee surgery, you can anticipate a more rapid recovery. Taking an active role by following your caregiver's instructions will help with rapid and complete recovery. Use crutches, rest, elevation, ice, and knee exercises as instructed. The length of recovery depends on various factors including type of injury, age, physical condition, medical conditions, and your rehabilitation. Your knee is the joint between the large bones (femur and tibia) in your leg. Cartilage covers these bone ends which are smooth and slippery and allow your knee to bend and move smoothly. Two menisci, thick, semi-lunar shaped pads of cartilage which form a rim inside the joint, help absorb shock and stabilize your knee. Ligaments bind the bones together and support your knee joint. Muscles move the joint, help support your knee, and take stress off the joint itself. Because of this all programs and physical therapy to rehabilitate an injured or repaired knee require rebuilding and strengthening your muscles. AFTER THE PROCEDURE  After the procedure, you will be moved to a recovery area until most of the effects of the medication have worn off. Your caregiver will discuss the test results with you.   Only take over-the-counter or prescription medicines for pain, discomfort, or fever as directed by your caregiver.    You have been scheduled for arthroscocpic knee surgery.  All surgeries carry some risk.  Remember you always have the option of continued nonsurgical treatment. However in this situation the risks vs. the benefits favor surgery as  the best treatment option. The risks of the surgery includes the following but is not limited to bleeding, infection, pulmonary embolus, death from anesthesia, nerve injury vascular injury or need for further surgery, continued pain.  Specific to this procedure the following risks and complications are rare but possible Stiffness, pain, weakness, giving out  I expect  recovery will be in 3-4 weeks some patients take 6 weeks.  You  will need physical therapy after the procedure  Stop any blood thinning medication: such as warfarin, coumadin, naprosyn, ibuprofen, advil, diclofenac, aspirin  

## 2011-10-17 ENCOUNTER — Telehealth: Payer: Self-pay | Admitting: Orthopedic Surgery

## 2011-10-17 NOTE — Telephone Encounter (Signed)
noted 

## 2011-10-17 NOTE — Telephone Encounter (Signed)
Called insurer, [employer] at ph# [phone], regarding out-patient surgery scheduled at [facility] for 10/26/11, with pre-op appointment scheduled 10/19/11, for knee arthroscopy, CPT codes 09811, 29880.  Spoke with Collie Siad, who verified that no pre-authorization is required for these procedures, reference is his name and today's date, 2:31p.m.  He then referred me to Benefits and Eligibility department:  Coverage is verified and effective, per on-line and per [place].  However, patient has a pre-existing clause -- states from 04/16/11 through 04/14/12, she is under a waiting period.  No guarantee of coverage on these related services under our provider; all pending review at time claim is received.  I called back to patient and relayed.  She is speaking with husband regarding the possibility of claim denial, and will call back with decision.

## 2011-10-17 NOTE — Telephone Encounter (Signed)
Ok please take off surgery schedule and clarify prior to scheduling

## 2011-10-17 NOTE — Telephone Encounter (Signed)
Patient called back, and wishes to proceed with the surgery as scheduled for 10/26/11.  Aware of, and understands, insurance clause and waiting period.

## 2011-10-18 ENCOUNTER — Encounter (HOSPITAL_COMMUNITY): Payer: Self-pay | Admitting: Pharmacy Technician

## 2011-10-18 NOTE — Telephone Encounter (Signed)
Spoke with patient and advised that based on last comments from Dr. Romeo Apple, elective surgery scheduled for 10/26/11 has been postponed until [employer] can confirm financial coverage of surgery charges.  Patient has a pre-existing condition that carries a waiting period of 04/16/11 thru 04/14/12 per [facility] and they are unable to guarantee coverage of services.  Patient asked if she could call [employer] and I confirmed that if she could have [employer] send [place] documentation in writing that they would cover surgery charges, we could possibly move forward.  Patient advised she and husband had discussed a possible loan to cover surgery since she is in a waiting period with [employer].  Advised that her 10/19/11 pre-op appt would be cancelled with [facility].  Patient confirmed understanding.

## 2011-10-18 NOTE — Patient Instructions (Addendum)
Your procedure is scheduled on: 10-26-2011  Report to [facility] at 6:15    AM.  Call this number if you have problems the morning of surgery: [phone]   Remember:   Do not drink or eat food:After Midnight.  :  Take these medicines the morning of surgery with A SIP OF WATER: none   Do not wear jewelry, make-up or nail polish.  Do not wear lotions, powders, or perfumes. You may wear deodorant.  Do not shave 48 hours prior to surgery. Men may shave face and neck.  Do not bring valuables to the hospital.  Contacts, dentures or bridgework may not be worn into surgery.  Leave suitcase in the car. After surgery it may be brought to your room.  For patients admitted to the hospital, checkout time is 11:00 AM the day of discharge.   Patients discharged the day of surgery will not be allowed to drive home.    Special Instructions: Shower using CHG 2 nights before surgery and the night before surgery.  If you shower the day of surgery use CHG.  Use special wash - you have one bottle of CHG for all showers.  You should use approximately 1/3 of the bottle for each shower.   Please read over the following fact sheets that you were given: Pain Booklet, MRSA Information, Surgical Site Infection Prevention and Care and Recovery After Surgery   Arthroscopic Procedure, Knee Care After Refer to this sheet in the next few weeks. These discharge instructions provide you with general information on caring for yourself after you leave the hospital. Your caregiver may also give you specific instructions. Your treatment has been planned according to the most current medical practices available, but unavoidable complications sometimes occur. If you have any problems or questions after discharge, please call your caregiver. HOME CARE INSTRUCTIONS  It will be normal to be sore for a couple days after surgery. See your caregiver if this seems to be getting worse rather than better. Only take over-the-counter or  prescription medicines for pain, discomfort, or fever as directed by your caregiver.  Take showers rather than baths, or as directed by your caregiver.  Change bandages (dressings) if necessary or as directed.  You may resume normal diet and activities as directed or allowed.  Avoid lifting or driving until you are directed otherwise.  Make an appointment to see your caregiver for stitches (suture) or staple removal as directed.  You may put ice on the area.  Put ice in a plastic bag.  Place a towel between your skin and the bag.  Leave the ice on for 15 to 20 minutes, 3 to 4 times per day for the first 2 days. SEEK MEDICAL CARE IF:   You have increased bleeding from your wounds.  You see redness, swelling, or have increasing pain in your wounds.  You have pus coming from your wound.  You have an oral temperature above 102 F (38.9 C).  You notice a bad smell coming from the wound or dressing.  You have severe pain with any motion of your knee. SEEK IMMEDIATE MEDICAL CARE IF:   You develop a rash.  You have difficulty breathing  You develop any reaction or side effects to medicines taken. MAKE SURE YOU:   Understand these instructions.  Will watch your condition.  Will get help right away if you are not doing well or get worse. Document Released: 07/21/2004 Document Revised: 03/26/2011 Document Reviewed: 03/27/2007 [employer] Patient Information 2013  [employer] Instructions Following General Anesthetic, Adult A nurse specialized in giving anesthesia (anesthetist) or a doctor specialized in giving anesthesia (anesthesiologist) gave you a medicine that made you sleep while a procedure was performed. For as long as 24 hours following this procedure, you may feel:  Dizzy.  Weak.  Drowsy. AFTER THE PROCEDURE After surgery, you will be taken to the recovery area where a nurse will monitor your progress. You will be allowed to go home when you are awake, stable,  taking fluids well, and without complications. For the first 24 hours following an anesthetic:  Have a responsible person with you.  Do not drive a car. If you are alone, do not take public transportation.  Do not drink alcohol.  Do not take medicine that has not been prescribed by your caregiver.  Do not sign important papers or make important decisions.  You may resume normal diet and activities as directed.  Change bandages (dressings) as directed.  Only take over-the-counter or prescription medicines for pain, discomfort, or fever as directed by your caregiver. If you have questions or problems that seem related to the anesthetic, call the hospital and ask for the anesthetist or anesthesiologist on call. SEEK IMMEDIATE MEDICAL CARE IF:   You develop a rash.  You have difficulty breathing.  You have chest pain.  You develop any allergic problems. Document Released: 04/09/2000 Document Revised: 03/26/2011 Document Reviewed: 11/18/2006 [facility] Patient Information 2013 [place], [place].

## 2011-10-19 ENCOUNTER — Inpatient Hospital Stay (HOSPITAL_COMMUNITY): Admission: RE | Admit: 2011-10-19 | Discharge: 2011-10-19 | Payer: BC Managed Care – PPO | Source: Ambulatory Visit

## 2011-10-24 ENCOUNTER — Telehealth: Payer: Self-pay | Admitting: Orthopedic Surgery

## 2011-10-24 NOTE — Telephone Encounter (Signed)
Patient has called back, and due to surgery being postponed as previously noted, until insurance pre-existing clause ends, she is asking for a letter that states she is available for work, with limitations.  States that [facility] [employer] is helping her to find work, but needs to state, due to knee, that she is unable to stand for long periods of time, or walk long distances, may need to use cane with walking, and limited bending, stooping.  Please advise.  Patient's ph# is [phone].

## 2011-10-25 ENCOUNTER — Encounter: Payer: Self-pay | Admitting: Orthopedic Surgery

## 2011-10-25 NOTE — Telephone Encounter (Signed)
Called patient, relayed.  Note is done.  Patient requests note to be mailed.  Done.

## 2011-10-25 NOTE — Telephone Encounter (Signed)
Okay to give her the note

## 2011-10-26 ENCOUNTER — Encounter (HOSPITAL_COMMUNITY): Admission: RE | Payer: Self-pay | Source: Ambulatory Visit

## 2011-10-26 ENCOUNTER — Ambulatory Visit (HOSPITAL_COMMUNITY)
Admission: RE | Admit: 2011-10-26 | Payer: BC Managed Care – PPO | Source: Ambulatory Visit | Admitting: Orthopedic Surgery

## 2011-10-26 SURGERY — ARTHROSCOPY, KNEE, WITH MEDIAL MENISCECTOMY
Anesthesia: General | Laterality: Right

## 2011-10-29 ENCOUNTER — Ambulatory Visit: Payer: BC Managed Care – PPO | Admitting: Orthopedic Surgery

## 2012-02-13 ENCOUNTER — Encounter (HOSPITAL_COMMUNITY): Payer: Self-pay | Admitting: Emergency Medicine

## 2012-02-13 ENCOUNTER — Emergency Department (HOSPITAL_COMMUNITY)
Admission: EM | Admit: 2012-02-13 | Discharge: 2012-02-13 | Disposition: A | Discharge status: Home / Self Care | Payer: BC Managed Care – PPO | Attending: Emergency Medicine | Admitting: Emergency Medicine

## 2012-02-13 DIAGNOSIS — F17200 Nicotine dependence, unspecified, uncomplicated: Secondary | ICD-10-CM | POA: Insufficient documentation

## 2012-02-13 DIAGNOSIS — Y929 Unspecified place or not applicable: Secondary | ICD-10-CM | POA: Insufficient documentation

## 2012-02-13 DIAGNOSIS — M23205 Derangement of unspecified medial meniscus due to old tear or injury, unspecified knee: Secondary | ICD-10-CM | POA: Insufficient documentation

## 2012-02-13 DIAGNOSIS — X500XXA Overexertion from strenuous movement or load, initial encounter: Secondary | ICD-10-CM | POA: Insufficient documentation

## 2012-02-13 DIAGNOSIS — M25569 Pain in unspecified knee: Secondary | ICD-10-CM

## 2012-02-13 DIAGNOSIS — G8929 Other chronic pain: Secondary | ICD-10-CM

## 2012-02-13 DIAGNOSIS — S99929A Unspecified injury of unspecified foot, initial encounter: Secondary | ICD-10-CM | POA: Insufficient documentation

## 2012-02-13 DIAGNOSIS — S8990XA Unspecified injury of unspecified lower leg, initial encounter: Secondary | ICD-10-CM | POA: Insufficient documentation

## 2012-02-13 DIAGNOSIS — E785 Hyperlipidemia, unspecified: Secondary | ICD-10-CM | POA: Insufficient documentation

## 2012-02-13 DIAGNOSIS — Z8659 Personal history of other mental and behavioral disorders: Secondary | ICD-10-CM | POA: Insufficient documentation

## 2012-02-13 DIAGNOSIS — Z79899 Other long term (current) drug therapy: Secondary | ICD-10-CM | POA: Insufficient documentation

## 2012-02-13 DIAGNOSIS — Y939 Activity, unspecified: Secondary | ICD-10-CM | POA: Insufficient documentation

## 2012-02-13 HISTORY — DX: Pain in unspecified knee: M25.569

## 2012-02-13 MED ORDER — ONDANSETRON 8 MG PO TBDP
8.0000 mg | ORAL_TABLET | Freq: Once | ORAL | Status: AC
  Administered 2012-02-13: 8 mg via ORAL
  Filled 2012-02-13: qty 1

## 2012-02-13 MED ORDER — HYDROMORPHONE HCL PF 1 MG/ML IJ SOLN
1.0000 mg | Freq: Once | INTRAMUSCULAR | Status: AC
  Administered 2012-02-13: 1 mg via INTRAMUSCULAR
  Filled 2012-02-13: qty 1

## 2012-02-13 MED ORDER — OXYCODONE-ACETAMINOPHEN 5-325 MG PO TABS: 1.0000 | ORAL_TABLET | ORAL | Status: AC | PRN

## 2012-02-13 NOTE — ED Notes (Signed)
Pt has chronic pain hs of r knee. Pt fell last night and c/o pain since to R knee. Pt tearful. Has taken aleves, ibuprofen, with minimal relief.

## 2012-02-13 NOTE — ED Provider Notes (Signed)
History     CSN: [id number]  Arrival date & time 02/13/12  1657   First MD Initiated Contact with Patient 02/13/12 1705      Chief Complaint  Patient presents with  . Knee Pain    (Consider location/radiation/quality/duration/timing/severity/associated sxs/prior treatment) HPI Comments: Patient with hx of chronic right knee pain c/o increased pain since the evening prior to ED arrival.  States that she slipped but and her knee twisted, but she did NOT fall.  She states that she has hx of a meniscus tear to her knee and she is waiting until April when her insurance goes into effect to have surgery.  States that she has been taking NSAIDs with minimal relief.  She denies numbness or weakness of the joint, redness , swelling, fever or chills.    Patient is a 52 y.o. female presenting with knee pain. The history is provided by the patient.  Knee Pain This is a chronic problem. The problem occurs constantly. The problem has been gradually worsening. Associated symptoms include arthralgias. Pertinent negatives include no chills, fever, headaches, joint swelling, nausea, neck pain, numbness, rash or weakness. The symptoms are aggravated by bending, standing, walking and twisting. She has tried NSAIDs, ice and heat for the symptoms. The treatment provided no relief.    Past Medical History  Diagnosis Date  . Anxiety   . Depression   . Hyperlipemia   . Knee pain     Past Surgical History  Procedure Date  . Knee surgery   . Ankle surgery   . Tonsillectomy     Family History  Problem Relation Age of Onset  . Aortic dissection Mother   . Cancer Father     Renal Ce    History  Substance Use Topics  . Smoking status: Current Every Day Smoker -- 0.5 packs/day  . Smokeless tobacco: Not on file  . Alcohol Use: Yes     Comment: At least 4 drinks a day.  Drinks beer.    OB History    Grav Para Term Preterm Abortions TAB SAB Ect Mult Living                  Review of Systems   Constitutional: Negative for fever and chills.  HENT: Negative for neck pain.   Gastrointestinal: Negative for nausea.  Genitourinary: Negative for dysuria and difficulty urinating.  Musculoskeletal: Positive for arthralgias. Negative for joint swelling.  Skin: Negative for color change, rash and wound.  Neurological: Negative for weakness, numbness and headaches.  All other systems reviewed and are negative.    Allergies  Bee venom  Home Medications   Current Outpatient Rx  Name  Route  Sig  Dispense  Refill  . SUPER B-COMPLEX PO CAPS   Oral   Take 1 capsule by mouth daily.           Marland Kitchen CALCIUM CARBONATE 600 MG PO TABS               . DEPO-PROVERA IM   Intramuscular   Inject into the muscle.         Carma Leaven M PLUS PO TABS   Oral   Take 1 tablet by mouth daily.           . OXYCODONE-ACETAMINOPHEN 5-325 MG PO TABS   Oral   Take 1 tablet by mouth every 4 (four) hours as needed for pain.   20 tablet   0   . PRAVASTATIN SODIUM 40 MG PO  TABS   Oral   Take 40 mg by mouth daily.         Marland Kitchen VITAMIN E 400 UNITS PO CAPS   Oral   Take 400 Units by mouth daily.           BP 141/84  Pulse 94  Temp 97.6 F (36.4 C) (Oral)  Resp 18  SpO2 100%  Physical Exam  Nursing note and vitals reviewed. Constitutional: She is oriented to person, place, and time. She appears well-developed and well-nourished. No distress.       Patient appears anxious  HENT:  Head: Normocephalic and atraumatic.  Cardiovascular: Normal rate, regular rhythm, normal heart sounds and intact distal pulses.   Pulmonary/Chest: Effort normal and breath sounds normal.  Musculoskeletal: She exhibits tenderness. She exhibits no edema.       ttp of the medial right knee.  No erythema, bruising or step-off deformity.    Neurological: She is alert and oriented to person, place, and time. She exhibits normal muscle tone. Coordination normal.  Skin: Skin is warm and dry. No erythema.    ED  Course  Procedures (including critical care time)  Labs Reviewed - No data to display No results found.   1. Chronic knee pain       MDM    MRI of the right knee from 11/24/10 shows a large bucket handle tear of the entire medial meniscus and chronic rupture of the ACL.  Pt has knee brace and walker at home.  She states she is waiting for insurance approval to have surgery which is not until April.  No actual fall last evening per the patient. Has seen Dr. Romeo Apple in the past.   Agrees to f/u with Dr. Romeo Apple again.  Pain improved after IM Dilaudid injection.   Prescribed: Percocet #[id number]   Najee Cowens L. [place], [place] 02/14/12 2201

## 2012-02-15 NOTE — ED Provider Notes (Signed)
Medical screening examination/treatment/procedure(s) were performed by non-physician practitioner and as supervising physician I was immediately available for consultation/collaboration.   Euleta Belson L Margues Filippini, MD 02/15/12 1515 

## 2012-04-23 ENCOUNTER — Encounter: Payer: Self-pay | Admitting: *Deleted

## 2012-04-24 ENCOUNTER — Other Ambulatory Visit: Payer: Self-pay | Admitting: Adult Health

## 2012-04-24 ENCOUNTER — Ambulatory Visit (INDEPENDENT_AMBULATORY_CARE_PROVIDER_SITE_OTHER): Payer: BC Managed Care – PPO | Admitting: Obstetrics & Gynecology

## 2012-04-24 ENCOUNTER — Encounter: Payer: Self-pay | Admitting: Obstetrics & Gynecology

## 2012-04-24 VITALS — BP 122/82 | Ht 64.0 in | Wt 182.0 lb

## 2012-04-24 DIAGNOSIS — Z3049 Encounter for surveillance of other contraceptives: Secondary | ICD-10-CM

## 2012-04-24 DIAGNOSIS — Z309 Encounter for contraceptive management, unspecified: Secondary | ICD-10-CM

## 2012-04-24 DIAGNOSIS — Z3202 Encounter for pregnancy test, result negative: Secondary | ICD-10-CM

## 2012-04-24 MED ORDER — MEDROXYPROGESTERONE ACETATE 150 MG/ML IM SUSP
150.0000 mg | Freq: Once | INTRAMUSCULAR | Status: AC
  Administered 2012-04-24: 150 mg via INTRAMUSCULAR

## 2012-04-24 NOTE — Telephone Encounter (Signed)
Will refill x 1 needs to see Dr. Despina Hidden

## 2012-06-16 ENCOUNTER — Other Ambulatory Visit: Payer: Self-pay | Admitting: Obstetrics & Gynecology

## 2012-06-16 ENCOUNTER — Other Ambulatory Visit (HOSPITAL_COMMUNITY): Payer: Self-pay | Admitting: Family Medicine

## 2012-06-16 DIAGNOSIS — Z139 Encounter for screening, unspecified: Secondary | ICD-10-CM

## 2012-06-19 ENCOUNTER — Ambulatory Visit (HOSPITAL_COMMUNITY)
Admission: RE | Admit: 2012-06-19 | Discharge: 2012-06-19 | Disposition: A | Discharge status: Home / Self Care | Payer: BC Managed Care – PPO | Source: Ambulatory Visit | Attending: Obstetrics & Gynecology | Admitting: Obstetrics & Gynecology

## 2012-06-19 DIAGNOSIS — Z1231 Encounter for screening mammogram for malignant neoplasm of breast: Secondary | ICD-10-CM | POA: Insufficient documentation

## 2012-06-19 DIAGNOSIS — Z139 Encounter for screening, unspecified: Secondary | ICD-10-CM

## 2012-06-23 ENCOUNTER — Other Ambulatory Visit: Payer: Self-pay | Admitting: Obstetrics & Gynecology

## 2012-06-23 DIAGNOSIS — R928 Other abnormal and inconclusive findings on diagnostic imaging of breast: Secondary | ICD-10-CM

## 2012-07-02 ENCOUNTER — Ambulatory Visit (HOSPITAL_COMMUNITY)
Admission: RE | Admit: 2012-07-02 | Discharge: 2012-07-02 | Disposition: A | Discharge status: Home / Self Care | Payer: BC Managed Care – PPO | Source: Ambulatory Visit | Attending: Obstetrics & Gynecology | Admitting: Obstetrics & Gynecology

## 2012-07-02 DIAGNOSIS — R928 Other abnormal and inconclusive findings on diagnostic imaging of breast: Secondary | ICD-10-CM

## 2012-07-15 ENCOUNTER — Other Ambulatory Visit: Payer: Self-pay | Admitting: Adult Health

## 2012-07-17 ENCOUNTER — Ambulatory Visit (INDEPENDENT_AMBULATORY_CARE_PROVIDER_SITE_OTHER): Payer: BC Managed Care – PPO | Admitting: Obstetrics & Gynecology

## 2012-07-17 ENCOUNTER — Ambulatory Visit: Payer: BC Managed Care – PPO

## 2012-07-17 ENCOUNTER — Encounter: Payer: Self-pay | Admitting: Obstetrics & Gynecology

## 2012-07-17 VITALS — BP 118/70 | Ht 64.0 in | Wt 186.0 lb

## 2012-07-17 DIAGNOSIS — Z3049 Encounter for surveillance of other contraceptives: Secondary | ICD-10-CM

## 2012-07-17 DIAGNOSIS — Z3202 Encounter for pregnancy test, result negative: Secondary | ICD-10-CM

## 2012-07-17 DIAGNOSIS — Z3200 Encounter for pregnancy test, result unknown: Secondary | ICD-10-CM

## 2012-07-17 DIAGNOSIS — Z309 Encounter for contraceptive management, unspecified: Secondary | ICD-10-CM

## 2012-07-17 LAB — POCT URINE PREGNANCY: Preg Test, Ur: NEGATIVE

## 2012-07-17 MED ORDER — MEDROXYPROGESTERONE ACETATE 150 MG/ML IM SUSP
150.0000 mg | Freq: Once | INTRAMUSCULAR | Status: AC
Start: 2012-07-17 — End: 2012-07-17
  Administered 2012-07-17: 150 mg via INTRAMUSCULAR

## 2012-07-17 NOTE — Progress Notes (Signed)
Patient ID: Jasmine Santos, female   DOB: 1960-02-28, 52 y.o.   MRN: [id number] Pt here today for DEPO injection

## 2012-10-09 ENCOUNTER — Other Ambulatory Visit: Payer: Self-pay | Admitting: Adult Health

## 2012-10-09 ENCOUNTER — Ambulatory Visit: Payer: BC Managed Care – PPO

## 2012-10-13 ENCOUNTER — Ambulatory Visit: Payer: BC Managed Care – PPO

## 2012-10-14 ENCOUNTER — Encounter: Payer: Self-pay | Admitting: Adult Health

## 2012-10-14 ENCOUNTER — Ambulatory Visit (INDEPENDENT_AMBULATORY_CARE_PROVIDER_SITE_OTHER): Payer: BC Managed Care – PPO | Admitting: Adult Health

## 2012-10-14 VITALS — BP 126/80 | Ht 64.0 in | Wt 181.0 lb

## 2012-10-14 DIAGNOSIS — Z3049 Encounter for surveillance of other contraceptives: Secondary | ICD-10-CM

## 2012-10-14 DIAGNOSIS — Z309 Encounter for contraceptive management, unspecified: Secondary | ICD-10-CM

## 2012-10-14 DIAGNOSIS — Z3202 Encounter for pregnancy test, result negative: Secondary | ICD-10-CM

## 2012-10-14 LAB — POCT URINE PREGNANCY: Preg Test, Ur: NEGATIVE

## 2012-10-14 MED ORDER — MEDROXYPROGESTERONE ACETATE 150 MG/ML IM SUSP
150.0000 mg | Freq: Once | INTRAMUSCULAR | Status: AC
  Administered 2012-10-14: 150 mg via INTRAMUSCULAR

## 2012-12-01 ENCOUNTER — Other Ambulatory Visit: Payer: Self-pay | Admitting: Obstetrics & Gynecology

## 2012-12-01 DIAGNOSIS — N6320 Unspecified lump in the left breast, unspecified quadrant: Secondary | ICD-10-CM

## 2013-01-06 ENCOUNTER — Ambulatory Visit: Payer: BC Managed Care – PPO

## 2013-01-06 ENCOUNTER — Other Ambulatory Visit: Payer: Self-pay | Admitting: Adult Health

## 2013-01-12 ENCOUNTER — Ambulatory Visit: Payer: BC Managed Care – PPO

## 2013-01-13 ENCOUNTER — Encounter (INDEPENDENT_AMBULATORY_CARE_PROVIDER_SITE_OTHER): Payer: Self-pay

## 2013-01-13 ENCOUNTER — Ambulatory Visit (INDEPENDENT_AMBULATORY_CARE_PROVIDER_SITE_OTHER): Payer: BC Managed Care – PPO | Admitting: Obstetrics & Gynecology

## 2013-01-13 ENCOUNTER — Encounter: Payer: Self-pay | Admitting: Obstetrics & Gynecology

## 2013-01-13 VITALS — BP 120/80 | Ht 64.0 in | Wt 180.0 lb

## 2013-01-13 DIAGNOSIS — Z309 Encounter for contraceptive management, unspecified: Secondary | ICD-10-CM

## 2013-01-13 DIAGNOSIS — Z3200 Encounter for pregnancy test, result unknown: Secondary | ICD-10-CM

## 2013-01-13 DIAGNOSIS — Z3202 Encounter for pregnancy test, result negative: Secondary | ICD-10-CM

## 2013-01-13 DIAGNOSIS — Z3049 Encounter for surveillance of other contraceptives: Secondary | ICD-10-CM

## 2013-01-13 MED ORDER — MEDROXYPROGESTERONE ACETATE 150 MG/ML IM SUSP
150.0000 mg | Freq: Once | INTRAMUSCULAR | Status: AC
  Administered 2013-01-13: 150 mg via INTRAMUSCULAR

## 2013-01-14 ENCOUNTER — Encounter (HOSPITAL_COMMUNITY): Payer: BC Managed Care – PPO

## 2013-04-07 ENCOUNTER — Ambulatory Visit: Payer: BC Managed Care – PPO

## 2013-04-09 ENCOUNTER — Encounter: Payer: Self-pay | Admitting: Obstetrics & Gynecology

## 2013-04-09 ENCOUNTER — Other Ambulatory Visit (HOSPITAL_COMMUNITY)
Admission: RE | Admit: 2013-04-09 | Discharge: 2013-04-09 | Disposition: A | Discharge status: Home / Self Care | Payer: BC Managed Care – PPO | Source: Ambulatory Visit | Attending: Obstetrics & Gynecology | Admitting: Obstetrics & Gynecology

## 2013-04-09 ENCOUNTER — Ambulatory Visit (INDEPENDENT_AMBULATORY_CARE_PROVIDER_SITE_OTHER): Payer: BC Managed Care – PPO | Admitting: Obstetrics & Gynecology

## 2013-04-09 VITALS — BP 118/80 | Ht 64.0 in | Wt 180.0 lb

## 2013-04-09 DIAGNOSIS — Z1151 Encounter for screening for human papillomavirus (HPV): Secondary | ICD-10-CM | POA: Insufficient documentation

## 2013-04-09 DIAGNOSIS — Z01419 Encounter for gynecological examination (general) (routine) without abnormal findings: Secondary | ICD-10-CM

## 2013-04-09 DIAGNOSIS — Z1212 Encounter for screening for malignant neoplasm of rectum: Secondary | ICD-10-CM
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
## 2013-11-01 ENCOUNTER — Encounter (HOSPITAL_COMMUNITY): Payer: Self-pay | Admitting: *Deleted

## 2013-11-01 ENCOUNTER — Emergency Department (HOSPITAL_COMMUNITY)
Admission: EM | Admit: 2013-11-01 | Discharge: 2013-11-01 | Disposition: A | Discharge status: Other Acute Inpatient Hospital | Payer: BC Managed Care – PPO | Attending: Emergency Medicine | Admitting: Emergency Medicine

## 2013-11-01 ENCOUNTER — Encounter (HOSPITAL_COMMUNITY): Payer: Self-pay | Admitting: Emergency Medicine

## 2013-11-01 ENCOUNTER — Inpatient Hospital Stay (HOSPITAL_COMMUNITY)
Admission: AD | Admit: 2013-11-01 | Discharge: 2013-11-03 | DRG: 885 | Disposition: A | Discharge status: Home / Self Care | Payer: 59 | Attending: Psychiatry | Admitting: Psychiatry

## 2013-11-01 DIAGNOSIS — Y908 Blood alcohol level of 240 mg/100 ml or more: Secondary | ICD-10-CM | POA: Diagnosis present

## 2013-11-01 DIAGNOSIS — R4589 Other symptoms and signs involving emotional state: Secondary | ICD-10-CM

## 2013-11-01 DIAGNOSIS — K219 Gastro-esophageal reflux disease without esophagitis: Secondary | ICD-10-CM | POA: Diagnosis present

## 2013-11-01 DIAGNOSIS — F10129 Alcohol abuse with intoxication, unspecified: Secondary | ICD-10-CM | POA: Diagnosis present

## 2013-11-01 DIAGNOSIS — F411 Generalized anxiety disorder: Secondary | ICD-10-CM | POA: Diagnosis present

## 2013-11-01 DIAGNOSIS — F332 Major depressive disorder, recurrent severe without psychotic features: Principal | ICD-10-CM | POA: Diagnosis present

## 2013-11-01 DIAGNOSIS — Z720 Tobacco use: Secondary | ICD-10-CM | POA: Insufficient documentation

## 2013-11-01 DIAGNOSIS — F17210 Nicotine dependence, cigarettes, uncomplicated: Secondary | ICD-10-CM | POA: Diagnosis present

## 2013-11-01 DIAGNOSIS — Z79899 Other long term (current) drug therapy: Secondary | ICD-10-CM | POA: Insufficient documentation

## 2013-11-01 DIAGNOSIS — R45851 Suicidal ideations: Secondary | ICD-10-CM | POA: Insufficient documentation

## 2013-11-01 DIAGNOSIS — R4689 Other symptoms and signs involving appearance and behavior: Secondary | ICD-10-CM

## 2013-11-01 DIAGNOSIS — F1010 Alcohol abuse, uncomplicated: Secondary | ICD-10-CM | POA: Diagnosis present

## 2013-11-01 DIAGNOSIS — F321 Major depressive disorder, single episode, moderate: Secondary | ICD-10-CM

## 2013-11-01 DIAGNOSIS — E785 Hyperlipidemia, unspecified: Secondary | ICD-10-CM | POA: Diagnosis present

## 2013-11-01 DIAGNOSIS — F329 Major depressive disorder, single episode, unspecified: Secondary | ICD-10-CM | POA: Diagnosis present

## 2013-11-01 DIAGNOSIS — F1410 Cocaine abuse, uncomplicated: Secondary | ICD-10-CM | POA: Diagnosis present

## 2013-11-01 DIAGNOSIS — F419 Anxiety disorder, unspecified: Secondary | ICD-10-CM | POA: Diagnosis present

## 2013-11-01 LAB — CBC WITH DIFFERENTIAL/PLATELET
BASOS PCT: 1 % (ref 0–1)
Basophils Absolute: 0.1 10*3/uL (ref 0.0–0.1)
Eosinophils Absolute: 0.5 10*3/uL (ref 0.0–0.7)
Eosinophils Relative: 6 % — ABNORMAL HIGH (ref 0–5)
HEMATOCRIT: 43.2 % (ref 36.0–46.0)
HEMOGLOBIN: 15.4 g/dL — AB (ref 12.0–15.0)
LYMPHS ABS: 4.4 10*3/uL — AB (ref 0.7–4.0)
LYMPHS PCT: 51 % — AB (ref 12–46)
MCH: 32.2 pg (ref 26.0–34.0)
MCHC: 35.6 g/dL (ref 30.0–36.0)
MCV: 90.2 fL (ref 78.0–100.0)
MONO ABS: 0.4 10*3/uL (ref 0.1–1.0)
MONOS PCT: 5 % (ref 3–12)
NEUTROS ABS: 3.1 10*3/uL (ref 1.7–7.7)
NEUTROS PCT: 37 % — AB (ref 43–77)
Platelets: 261 10*3/uL (ref 150–400)
RBC: 4.79 MIL/uL (ref 3.87–5.11)
RDW: 12.4 % (ref 11.5–15.5)
WBC: 8.4 10*3/uL (ref 4.0–10.5)

## 2013-11-01 LAB — RAPID URINE DRUG SCREEN, HOSP PERFORMED
AMPHETAMINES: NOT DETECTED
Barbiturates: NOT DETECTED
Benzodiazepines: NOT DETECTED
Cocaine: POSITIVE — AB
OPIATES: NOT DETECTED
Tetrahydrocannabinol: NOT DETECTED

## 2013-11-01 LAB — COMPREHENSIVE METABOLIC PANEL
ALBUMIN: 4.1 g/dL (ref 3.5–5.2)
ALK PHOS: 104 U/L (ref 39–117)
ALT: 18 U/L (ref 0–35)
ANION GAP: 15 (ref 5–15)
AST: 22 U/L (ref 0–37)
BILIRUBIN TOTAL: 0.2 mg/dL — AB (ref 0.3–1.2)
BUN: 9 mg/dL (ref 6–23)
CHLORIDE: 103 meq/L (ref 96–112)
CO2: 22 meq/L (ref 19–32)
CREATININE: 0.68 mg/dL (ref 0.50–1.10)
Calcium: 9.2 mg/dL (ref 8.4–10.5)
GFR calc Af Amer: >90 mL/min (ref >90)
GLUCOSE: 108 mg/dL — AB (ref 70–99)
POTASSIUM: 3.8 meq/L (ref 3.7–5.3)
Sodium: 140 mEq/L (ref 137–147)
Total Protein: 7.8 g/dL (ref 6.0–8.3)

## 2013-11-01 LAB — ETHANOL: ALCOHOL ETHYL (B): 265 mg/dL — AB (ref 0–11)

## 2013-11-01 MED ORDER — PRAVASTATIN SODIUM 40 MG PO TABS
40.0000 mg | ORAL_TABLET | Freq: Every day | ORAL | Status: DC
  Filled 2013-11-01: qty 1

## 2013-11-01 MED ORDER — MAGNESIUM HYDROXIDE 400 MG/5ML PO SUSP: 30.0000 mL | Freq: Every day | ORAL | Status: DC | PRN

## 2013-11-01 MED ORDER — ALUM & MAG HYDROXIDE-SIMETH 200-200-20 MG/5ML PO SUSP: 30.0000 mL | ORAL | Status: DC | PRN

## 2013-11-01 MED ORDER — ACETAMINOPHEN 325 MG PO TABS
650.0000 mg | ORAL_TABLET | Freq: Four times a day (QID) | ORAL | Status: DC | PRN
  Administered 2013-11-01: 650 mg via ORAL
  Filled 2013-11-01: qty 2

## 2013-11-01 MED ORDER — ALPRAZOLAM 0.5 MG PO TABS
0.2500 mg | ORAL_TABLET | Freq: Every evening | ORAL | Status: DC | PRN
  Administered 2013-11-01: 0.25 mg via ORAL
  Filled 2013-11-01: qty 1
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
## 2013-11-01 NOTE — ED Notes (Signed)
Pt requested to be reassessed. Informed Dr. Fayrene FearingJames, who stated that we could call [facility] to inform them of this request. Sp[facility] Baxter [facility]rist[facility] who was very understanding but said it was probably not possible due to assessments occuring every 24 hours, but that she would let one of the "extenders" know of the request.

## 2013-11-01 NOTE — ED Provider Notes (Signed)
CSN: [id number]     Arrival date & time 11/01/13  0407 History   First MD Initiated Contact with Patient 11/01/13 0413     Chief Complaint  Patient presents with  . V70.1     (Consider location/radiation/quality/duration/timing/severity/associated sxs/prior Treatment) Patient is a 53 y.o. female presenting with altered mental status. The history is provided by the spouse (the  pt threatened to kill herself by taking pills).  Altered Mental Status Presenting symptoms: behavior changes   Most recent episode:  Today Episode history:  Multiple Timing:  Constant Progression:  Worsening Chronicity:  Recurrent Context: alcohol use   Associated symptoms: no abdominal pain, no hallucinations, no headaches, no rash and no seizures     Past Medical History  Diagnosis Date  . Anxiety   . Depression   . Hyperlipemia   . Knee pain    Past Surgical History  Procedure Laterality Date  . Knee surgery    . Ankle surgery    . Tonsillectomy     Family History  Problem Relation Age of Onset  . Aortic dissection Mother   . Cancer Father     Renal Ce   History  Substance Use Topics  . Smoking status: Current Every Day Smoker -- 0.50 packs/day for 25 years    Types: Cigarettes  . Smokeless tobacco: Never Used  . Alcohol Use: Yes     Comment: At least 4 drinks a day.  Drinks beer.   OB History   Grav Para Term Preterm Abortions TAB SAB Ect Mult Living                 Review of Systems  Constitutional: Negative for appetite change and fatigue.  HENT: Negative for congestion, ear discharge and sinus pressure.   Eyes: Negative for discharge.  Respiratory: Negative for cough.   Cardiovascular: Negative for chest pain.  Gastrointestinal: Negative for abdominal pain and diarrhea.  Genitourinary: Negative for frequency and hematuria.  Musculoskeletal: Negative for back pain.  Skin: Negative for rash.  Neurological: Negative for seizures and headaches.  Psychiatric/Behavioral:  Positive for dysphoric mood. Negative for hallucinations.      Allergies  Review of patient's allergies indicates no known allergies.  Home Medications   Prior to Admission medications   Medication Sig Start Date End Date Taking? Authorizing Provider  ALPRAZolam Prudy Feeler(XANAX) 0.25 MG tablet Take 0.25 mg by mouth at bedtime as needed for anxiety.    Historical Provider, MD  pravastatin (PRAVACHOL) 40 MG tablet Take 40 mg by mouth daily.    Historical Provider, MD   BP 139/98  Pulse 94  Temp(Src) 97.7 F (36.5 C)  Resp 20  Ht 5\' 4"  (1.626 m)  Wt 165 lb (74.844 kg)  BMI 28.31 kg/m2  SpO2 95% Physical Exam  Constitutional: She is oriented to person, place, and time. She appears well-developed.  HENT:  Head: Normocephalic.  Eyes: Conjunctivae and EOM are normal. No scleral icterus.  Neck: Neck supple. No thyromegaly present.  Cardiovascular: Normal rate and regular rhythm.  Exam reveals no gallop and no friction rub.   No murmur heard. Pulmonary/Chest: No stridor. She has no wheezes. She has no rales. She exhibits no tenderness.  Abdominal: She exhibits no distension. There is no tenderness. There is no rebound.  Musculoskeletal: Normal range of motion. She exhibits no edema.  Lymphadenopathy:    She has no cervical adenopathy.  Neurological: She is oriented to person, place, and time. She exhibits normal muscle tone. Coordination normal.  Skin: No rash noted. No erythema.  Psychiatric:  Suicidal ideations    ED Course  Procedures (including critical care time) Labs Review Labs Reviewed  ETHANOL - Abnormal; Notable for the following:    Alcohol, Ethyl (B) 265 (*)    All other components within normal limits  CBC WITH DIFFERENTIAL - Abnormal; Notable for the following:    Hemoglobin 15.4 (*)    Neutrophils Relative % 37 (*)    Lymphocytes Relative 51 (*)    Lymphs Abs 4.4 (*)    Eosinophils Relative 6 (*)    All other components within normal limits  COMPREHENSIVE  METABOLIC PANEL - Abnormal; Notable for the following:    Glucose, Bld 108 (*)    Total Bilirubin 0.2 (*)    All other components within normal limits  URINE RAPID DRUG SCREEN (HOSP PERFORMED) - Abnormal; Notable for the following:    Cocaine POSITIVE (*)    All other components within normal limits    Imaging Review No results found.   EKG Interpretation None      MDM   Final diagnoses:  None    tts rec.  committment     Benny LennertJoseph L Adilenne Ashworth, MD 11/01/13 [phone]

## 2013-11-01 NOTE — BH Assessment (Addendum)
[facility] Assessment Progress Note  The following facilities were contacted in an attempt to place the pt, as [facility]:   Referral Faxed For Review:  [facility] - beds per Victorino DikeJennifer - referral faxed  [facility] - Left message - Referral faxed  [facility] beds per Noreene LarssonJill - referral faxed  [facility] - beds per Anselm Junglingeedra - referral faxed  [facility] - beds per Florentina AddisonKatie - referral faxed [facility] GroveBrynn Marr - beds available - referral faxed  [facility] - beds per Doreatha MassedAnajean - referral faxed  [facility] - beds per [facility] - referral faxed    No Available Beds  [facility]-at capacity per Lacey Jensenanny  [facility] - Only female beds per Carlyon ProwsKristen  [facility] - Only Gero beds per [facility]  [facility] - At capacity per Sinclair Shiphristine  [facility] - At capacity per Laney PastorMegan  [place] - At capacity per [facility]  [facility] - At capacity per Ashley MarinerLisa  [facility] - At capacity per [facility]  [facility] - Gero beds only per Smith RobertJennifer  [place]-At capacity per Alveria Apleylivia  [place] - at capacity   No Answer  [facility] - left message @ [phone]  [facility] - left message @ [phone]   TTS will continue to seek placement for the pt.   Casimer LaniusKristen Jaretzi Droz, MS, [facility]  Licensed Professional Counselor  Therapeutic Triage Specialist  [facility]  Phone: [phone]  Fax: [phone]

## 2013-11-01 NOTE — ED Notes (Signed)
Kristen from [facility] called to say [facility] may have a bed at [facility]. Requested IVC paperwork to be faxed to her. Secretary is aware and will fax paperwork.

## 2013-11-01 NOTE — Progress Notes (Signed)
Patient did attend the evening speaker AA meeting.  
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
## 2013-11-01 NOTE — Tx Team (Signed)
Initial Interdisciplinary Treatment Plan   PATIENT STRESSORS: Marital or family conflict   PROBLEM LIST: Problem List/Patient Goals Date to be addressed Date deferred Reason deferred Estimated date of resolution  Depression  11/01/13                                                      DISCHARGE CRITERIA:  Improved stabilization in mood, thinking, and/or behavior Need for constant or close observation no longer present  PRELIMINARY DISCHARGE PLAN: Return to previous living arrangement  PATIENT/FAMIILY INVOLVEMENT: This treatment plan has been presented to and reviewed with the patient, Jasmine Santos, and/or family member.  The patient and family have been given the opportunity to ask questions and make suggestions.  Jasmine Santos, Jasmine Santos 11/01/2013, 6:58 PM

## 2013-11-01 NOTE — BH Assessment (Signed)
Assessment complete. Per Binnie RailJoann Glover, [facility], adult unit is at capacity. Gave clinical report to Maryjean Mornharles Kober, PA-C who recommends inpatient treatment and if Pt will not agree to be voluntarily admitted she should be placed under IVC. TTS will contact area facilities for bed availability. Informed Dr. Clelia SchaumannJ. Zammit and Lawson FiscalLori, RN of recommendation.   Harlin RainFord Ellis Ria CommentWarrick Jr, LPC, [facility] Triage Specialist [phone]

## 2013-11-01 NOTE — Progress Notes (Signed)
Patient ID: Jasmine CageLori Heskett, female   DOB: 07/15/1960, 53 y.o.   MRN: [id number]   53 year old white female admitted after she [facility] per her husband with depression/SI. Per patient she got upset with her husband because he was leaving out the house to go and see another women in the community. Pt reported that her husband told her that the other women needed him because she was going to hurt herself. Pt reported that after her husband told her that she got very upset, and said some things. Pt reported that she told her husband that she was going to overdose. Pt reported that she had no intentions of hurting herself. Pt reported that she has a history of depression, but that she takes [employer]. C[employer]s Wort and that is all she needs. At time of admission pt reported being negative SI/HI, no AH/VH noted.

## 2013-11-01 NOTE — ED Notes (Signed)
Pt's husband Jonny RuizJohn made aware that pt will be staying here in the hospital until she is placed somewhere.   [name]-husband-[phone]

## 2013-11-01 NOTE — BH Assessment (Signed)
Received call for assessment. Spoke with Dr. Estell HarpinZammit who said Pt has been drinking tonight and threatening suicide. She is currently inebriated but is able to answer questions. Tele-assessment will be initiated.  Harlin RainFord Ellis Ria CommentWarrick Jr, LPC, [facility] Triage Specialist [phone]

## 2013-11-01 NOTE — BH Assessment (Signed)
[facility] Assessment Progress Note   Update:  Per Berneice Heinrichina Tate, [facility], [facility] and has been accepted to Dr. Jama Flavorsobos.  Updated pt's nurse, Lurena Joinerebecca, obtained copy of IVC paperwork.  Lurena JoinerRebecca to update EDP Adriana Simasook and ED staff.  Pt to arrive via law enforcement as under IVC and [facility].  TTS staff updated.  Casimer LaniusKristen Zinnia Tindall, MS, [facility] Licensed Professional Counselor Therapeutic Triage Specialist [facility] Phone: [phone] Fax: [phone]

## 2013-11-01 NOTE — ED Notes (Signed)
Pt awake eating breakfast    Calm and cooperative

## 2013-11-01 NOTE — ED Notes (Signed)
Attempted to call [facility] for report- no answer- rang 15 times.

## 2013-11-02 ENCOUNTER — Encounter (HOSPITAL_COMMUNITY): Payer: Self-pay | Admitting: Psychiatry

## 2013-11-02 DIAGNOSIS — F1010 Alcohol abuse, uncomplicated: Secondary | ICD-10-CM | POA: Diagnosis present

## 2013-11-02 DIAGNOSIS — F1994 Other psychoactive substance use, unspecified with psychoactive substance-induced mood disorder: Secondary | ICD-10-CM

## 2013-11-02 DIAGNOSIS — F10129 Alcohol abuse with intoxication, unspecified: Secondary | ICD-10-CM | POA: Diagnosis present

## 2013-11-02 MED ORDER — CHLORDIAZEPOXIDE HCL 25 MG PO CAPS: 25.0000 mg | ORAL_CAPSULE | Freq: Four times a day (QID) | ORAL | Status: DC | PRN

## 2013-11-02 MED ORDER — VITAMIN B-1 100 MG PO TABS
100.0000 mg | ORAL_TABLET | Freq: Every day | ORAL | Status: DC
  Administered 2013-11-03: 100 mg via ORAL
  Filled 2013-11-02 (×3): qty 1

## 2013-11-02 MED ORDER — LOPERAMIDE HCL 2 MG PO CAPS: 2.0000 mg | ORAL_CAPSULE | ORAL | Status: DC | PRN

## 2013-11-02 MED ORDER — CHLORDIAZEPOXIDE HCL 25 MG PO CAPS
25.0000 mg | ORAL_CAPSULE | Freq: Four times a day (QID) | ORAL | Status: DC
  Administered 2013-11-02 – 2013-11-03 (×3): 25 mg via ORAL
  Filled 2013-11-02 (×3): qty 1

## 2013-11-02 MED ORDER — ADULT MULTIVITAMIN W/MINERALS CH
1.0000 | ORAL_TABLET | Freq: Every day | ORAL | Status: DC
  Administered 2013-11-03: 1 via ORAL
  Filled 2013-11-02 (×3): qty 1

## 2013-11-02 MED ORDER — PANTOPRAZOLE SODIUM 40 MG PO TBEC
40.0000 mg | DELAYED_RELEASE_TABLET | Freq: Every day | ORAL | Status: DC
  Administered 2013-11-02 – 2013-11-03 (×2): 40 mg via ORAL
  Filled 2013-11-02 (×6): qty 1

## 2013-11-02 MED ORDER — ALPRAZOLAM 0.25 MG PO TABS
0.2500 mg | ORAL_TABLET | Freq: Three times a day (TID) | ORAL | Status: DC | PRN
  Administered 2013-11-02 (×2): 0.25 mg via ORAL
  Filled 2013-11-02 (×2): qty 1

## 2013-11-02 MED ORDER — PRAVASTATIN SODIUM 40 MG PO TABS
40.0000 mg | ORAL_TABLET | Freq: Every day | ORAL | Status: DC
  Administered 2013-11-02 – 2013-11-03 (×2): 40 mg via ORAL
  Filled 2013-11-02 (×4): qty 1

## 2013-11-02 MED ORDER — NAPROXEN 250 MG PO TABS
250.0000 mg | ORAL_TABLET | Freq: Every day | ORAL | Status: DC
  Administered 2013-11-02: 250 mg via ORAL
  Filled 2013-11-02 (×3): qty 1

## 2013-11-02 MED ORDER — CHLORDIAZEPOXIDE HCL 25 MG PO CAPS: 25.0000 mg | ORAL_CAPSULE | Freq: Every day | ORAL | Status: DC

## 2013-11-02 MED ORDER — HYDROXYZINE HCL 25 MG PO TABS: 25.0000 mg | ORAL_TABLET | Freq: Four times a day (QID) | ORAL | Status: DC | PRN

## 2013-11-02 MED ORDER — ONDANSETRON 4 MG PO TBDP: 4.0000 mg | ORAL_TABLET | Freq: Four times a day (QID) | ORAL | Status: DC | PRN

## 2013-11-02 MED ORDER — CHLORDIAZEPOXIDE HCL 25 MG PO CAPS
25.0000 mg | ORAL_CAPSULE | Freq: Three times a day (TID) | ORAL | Status: DC
Start: 2013-11-04 — End: 2013-11-03

## 2013-11-02 MED ORDER — NAPROXEN SODIUM 275 MG PO TABS
275.0000 mg | ORAL_TABLET | Freq: Every day | ORAL | Status: DC
  Filled 2013-11-02: qty 1

## 2013-11-02 MED ORDER — THIAMINE HCL 100 MG/ML IJ SOLN: 100.0000 mg | Freq: Once | INTRAMUSCULAR | Status: DC

## 2013-11-02 MED ORDER — CHLORDIAZEPOXIDE HCL 25 MG PO CAPS: 25.0000 mg | ORAL_CAPSULE | ORAL | Status: DC

## 2013-11-02 NOTE — Progress Notes (Signed)
D:  Patient's self inventory, patient sleeps fair, no sleep medication needed.  Good appetite, normal energy level, good concentration.  Rated depression 7, anxiety 8, denied hopeless.  Denied withdrawals.  Denied SI.  Denied physical problems.  Goal today is to work on anxiety and depression, attend groups.  No discharge plans.  No problems anticipated after discharge. A:  Medications administered per MD orders.  Emotional support and encouragement given patient. R:  Denied SI and HI, contracts for safety.  Denied A/V hallucinations.  Safety maintained with 15 minute checks.

## 2013-11-02 NOTE — Progress Notes (Signed)
Adult Psychoeducational Group Note  Date:  11/02/2013 Time:  10:00AM  Group Topic/Focus:  Dimensions of Wellness:   The focus of this group is to introduce the topic of wellness and discuss the role each dimension of wellness plays in total health.  Participation Level:  Did Not Attend  Additional Comments:  Pt did not attend group. Pt was meeting with the unit doctor at this time.  Terryl Molinelli K 11/02/2013, 10:24 AM

## 2013-11-02 NOTE — [facility] Group Notes (Signed)
[facility]   11/02/2013 10:29 AM    Participation Quality:  Appropraite  Mood/Affect:  Appropriate  Depression Rating:  5  Anxiety Rating:  7  Thoughts of Suicide:  No  Will you contract for safety?   NA  Current AVH:  No  Plan for Discharge/Comments:  Patient attended discharge planning group and actively participated in group. She reports admitting to hospital due to husband calling 911 after she made suicidal comments.  Patient denies being suicidal but reports having three previous attempts.  She advised of not having outpatient services at this time.  Patient shared she plans to return to her home at discharge.   Transportation Means: Patient has transportation.   Supports:  Patient has a support system.   Bertina Guthridge, Joesph JulyQuylle Hairston

## 2013-11-02 NOTE — [facility] Suicide Risk Assessment (Signed)
[facility] INPATIENT:  Family/Significant Other Suicide Prevention Education  Suicide Prevention Education:  Education Completed; Jasmine ShellerJohn Mcilwain, Husband, [phone];  has been identified by the patient as the family member/significant other with whom the patient will be residing, and identified as the person(s) who will aid the patient in the event of a mental health crisis (suicidal ideations/suicide attempt).  With written consent from the patient, the family member/significant other has been provided the following suicide prevention education, prior to the and/or following the discharge of the patient.  The suicide prevention education provided includes the following:  Suicide risk factors  Suicide prevention and interventions  National Suicide Hotline telephone number  [facility] assessment telephone number  [facility] Emergency Assistance 911  [facility] and/or Residential Mobile Crisis Unit telephone number  Request made of family/significant other to:  Remove weapons (e.g., guns, rifles, knives), all items previously/currently identified as safety concern.  Husband advised patient does not have access to weapons.     Remove drugs/medications (over-the-counter, prescriptions, illicit drugs), all items previously/currently identified as a safety concern.  The family member/significant other verbalizes understanding of the suicide prevention education information provided.  The family member/significant other agrees to remove the items of safety concern listed above.  Wynn BankerHodnett, Leslee Suire Hairston 11/02/2013, 3:37 PM

## 2013-11-02 NOTE — Progress Notes (Signed)
D: Pt has anxious affect and mood.  Pt reports "this is kind of a misunderstanding.  My husband really upset me and I said: see if you wake up in the morning and I don't."  Pt denies SI at this time.  She denies HI, denies hallucinations.  Pt reports "I usually take Xanax for sleep.  Will I get that tonight?  I might need something for sleep."  She interacts cautiously with staff and peers.   A: Met with pt 1:1 and provided support and encouragement.  PRN medication administered for chronic hip pain, see flowsheet.   Pt given extra pillow per request.  After group, pt stated "I don't need anything for sleep, I'm actually kind of tired because I didn't take a nap today."  Safety maintained. R: Pt is in no distress and verbally contracts for safety.  Will continue to monitor and assess for safety.   

## 2013-11-02 NOTE — Progress Notes (Signed)
[facility] Group Notes:  (Nursing/MHT/Case Management/Adjunct)  Date:  11/02/2013  Time:  2:54 PM  Type of Therapy:  Therapeutic Activity  Participation Level:  Active  Participation Quality:  Appropriate  Affect:  Appropriate  Cognitive:  Appropriate  Insight:  Appropriate  Engagement in Group:  Engaged  Modes of Intervention:  Activity  Summary of Progress/Problems: Pts played "Leisure ABC's." Pts identified positive, healthy activities that correspond with each letter of the alphabet.  Jasmine Santos C 11/02/2013, 2:54 PM 
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
## 2013-11-02 NOTE — Plan of Care (Signed)
Problem: Ineffective individual coping Goal: STG: Patient will remain free from self harm Outcome: Progressing Pt has remained free from self harm since admission.    Problem: Diagnosis: Increased Risk For Suicide Attempt Goal: STG-Patient Will Attend All Groups On The Unit Outcome: Progressing Pt attended evening group on 11/01/2013.

## 2013-11-02 NOTE — [facility] Suicide Risk Assessment (Signed)
Suicide Risk Assessment  Admission Assessment     Nursing information obtained from:  Patient Demographic factors:  Caucasian Current Mental Status:  Suicidal ideation indicated by patient Loss Factors:  Loss of significant relationship Historical Factors:  Prior suicide attempts Risk Reduction Factors:  Sense of responsibility to family Total Time spent with patient: 45 minutes  CLINICAL FACTORS:   Depression:   Comorbid alcohol abuse/dependence Impulsivity Alcohol/Substance Abuse/Dependencies  COGNITIVE FEATURES THAT CONTRIBUTE TO RISK:  Closed-mindedness Polarized thinking Thought constriction (tunnel vision)    SUICIDE RISK:   Moderate:  Frequent suicidal ideation with limited intensity, and duration, some specificity in terms of plans, no associated intent, good self-control, limited dysphoria/symptomatology, some risk factors present, and identifiable protective factors, including available and accessible social support.  PLAN OF CARE: Supportive approach/coping skills/relapse prevention                               Identify and address detox needs                               Reassess and address the co morbidities  I certify that inpatient services furnished can reasonably be expected to improve the patient's condition.  Jasmine Santos A 11/02/2013, 5:23 PM
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
## 2013-11-03 DIAGNOSIS — F332 Major depressive disorder, recurrent severe without psychotic features: Principal | ICD-10-CM

## 2013-11-03 DIAGNOSIS — F1019 Alcohol abuse with unspecified alcohol-induced disorder: Secondary | ICD-10-CM

## 2013-11-03 DIAGNOSIS — F1410 Cocaine abuse, uncomplicated: Secondary | ICD-10-CM

## 2013-11-03 DIAGNOSIS — F411 Generalized anxiety disorder: Secondary | ICD-10-CM

## 2013-11-03 MED ORDER — ADULT MULTIVITAMIN W/MINERALS CH: 1.0000 | ORAL_TABLET | Freq: Every day | ORAL | Status: DC

## 2013-11-03 MED ORDER — THIAMINE HCL 100 MG PO TABS: 100.0000 mg | ORAL_TABLET | Freq: Every day | ORAL | Status: DC

## 2013-11-03 MED ORDER — PANTOPRAZOLE SODIUM 40 MG PO TBEC: 40.0000 mg | DELAYED_RELEASE_TABLET | Freq: Every day | ORAL | Status: DC

## 2013-11-03 MED ORDER — NAPROXEN 250 MG PO TABS: 250.0000 mg | ORAL_TABLET | Freq: Every day | ORAL | Status: DC

## 2013-11-03 MED ORDER — PRAVASTATIN SODIUM 40 MG PO TABS: 40.0000 mg | ORAL_TABLET | Freq: Every day | ORAL | Status: DC

## 2013-11-03 NOTE — Plan of Care (Signed)
Problem: Ineffective individual coping Goal: LTG: Patient will report a decrease in negative feelings Outcome: Progressing Pt reports improvement in mood with present medications and feels she will be ready for discharge tomorrow, 10/20.
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
## 2013-11-03 NOTE — Progress Notes (Signed)
[facility] :  Will you be returning to the same living situation after discharge: Yes,  Patient is returning to her home. At discharge, do you have transportation home?:Yes,  Husband to provide transportation. Do you have the ability to pay for your medications:No. Patient needs assistance with indigent medications   Release of information consent forms completed and in the chart;  Patient's signature needed at discharge.  Patient to Follow up at: Follow-up Information   Follow up with [facility] On 11/09/2013. (You are scheduled with [facility] on Monday, November 09, 2013 at 11AM.  Bill Salinaseferrral numbder [phone])    Contact information:   [address] [place], [place]   [id number]  [phone]      Patient denies SI/HI: Patient no longer endorsing SI/HI or other thoughts of self harm.  Safety Planning and Suicide Prevention discussed: .Reviewed with all patients during discharge planning group  Jasmine Santos, Jasmine Santos 11/03/2013, 12:44 PM

## 2013-11-03 NOTE — Progress Notes (Signed)
D:  Patient's self inventory sheet, patient slept good, sleep medication is helpful.  Good appetite, normal energy level, good concentration.  Rated depression 1, denied hopeless, anxiety #2.  Denied withdrawals.  Denied SI.  Denied physical problems.  Denied pain.  Goal today is to go home. Plans to keep feeling well, being positive.  Does have discharge plans.  No problems with plans after discharge. A:  Medications administered per MD orders.  Emotional support and encouragement given patient. R:  Denied SI and HI.  Denied A/V hallucinations.  Safety maintained with 15 minute checks.

## 2013-11-03 NOTE — [facility] Group Notes (Signed)
The focus of this group is to educate the patient on the purpose and policies of crisis stabilization and provide a format to answer questions about their admission.  The group details unit policies and expectations of patients while admitted.  Patient attended 0900 nurse education orientation group this morning.  Patient actively participated, appropriate affect, alert, appropriate insight and engagement.  Today patient will work on 3 goals for discharge.  
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
## 2013-11-03 NOTE — Tx Team (Signed)
Interdisciplinary Treatment Plan Update   Date Reviewed:  11/03/2013  Time Reviewed:  8:36 AM  Progress in Treatment:   Attending groups: Yes Participating in groups: Yes, patient easily engaged in group Taking medication as prescribed: Yes  Tolerating medication: Yes Family/Significant other contact made:  Yes, collateral contact with husband Patient understands diagnosis: Yes, patient understands diagnosis and need for treatment. Discussing patient identified problems/goals with staff: Yes, patient is able to express goals for treatment and discharge. Medical problems stabilized or resolved: Yes Denies suicidal/homicidal ideation: Yes Patient has not harmed self or others: Yes  For review of initial/current patient goals, please see plan of care.  Estimated Length of Stay:  discharge today  Reasons for Continued Hospitalization:   New Problems/Goals identified:    Discharge Plan or Barriers:   Home with outpatient follow up with [facility].  Additional Comments:   Patient and CSW reviewed patient's identified goals and treatment plan.  Patient verbalized understanding and agreed to treatment plan.   Attendees:  Patient:  11/03/2013 8:36 AM   Signature:  Sallyanne HaversF. Cobos, MD 11/03/2013 8:36 AM  Signature: Geoffery LyonsIrving Lugo, MD 11/03/2013 8:36 AM  Signature:  Harold Barbanonecia Byrd, RN 11/03/2013 8:36 AM  Signature:Beverly Terrilee CroakKnight, RN 11/03/2013 8:36 AM  Signature:   11/03/2013 8:36 AM  Signature:  Juline PatchQuylle Ibn Stief, LCSW 11/03/2013 8:36 AM  Signature:  Samuella BruinKristin Drinkard, LCSW-A 11/03/2013 8:36 AM  Signature:  Leisa LenzValerie Enoch, Care Coordinator [facility] 11/03/2013 8:36 AM  Signature:  Jon Gillselora Sutton, [facility] Transition Team  11/03/2013 8:36 AM  Signature:  11/03/2013  8:36 AM  Signature:   Onnie BoerJennifer Clark, RN [facility] 11/03/2013  8:36 AM  Signature:   11/03/2013  8:36 AM    Scribe for Treatment Team:   Juline PatchQuylle Westlyn Glaza,  11/03/2013 8:36 AM

## 2013-11-03 NOTE — Progress Notes (Signed)
Discharge Note:  Patient discharged home with family member.  Denied A/V hallucinations.  Denied SI and HI.  Denied pain.  Suicide prevention information given and discussed with patient who stated she understood and had no questions.  Patient stated she received all her belongings, clothing, misc items, toiletries, kindel in green case.  Patient stated she appreciated all assistance [facility].
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
## 2013-11-06 NOTE — Progress Notes (Signed)
Patient Discharge Instructions:  After Visit Summary (AVS):   Faxed to:  11/06/13 Discharge Summary Note:   Faxed to:  11/06/13 Psychiatric Admission Assessment Note:   Faxed to:  11/06/13 Suicide Risk Assessment - Discharge Assessment:   Faxed to:  11/06/13 Faxed/Sent to the Next Level Care provider:  11/06/13 Faxed to [facility] @ [phone]  Jerelene ReddenSheena E Whitewater, 11/06/2013, 2:07 PM

## 2015-02-01 ENCOUNTER — Emergency Department (HOSPITAL_COMMUNITY): Payer: Self-pay

## 2015-02-01 ENCOUNTER — Emergency Department (HOSPITAL_COMMUNITY)
Admission: EM | Admit: 2015-02-01 | Discharge: 2015-02-01 | Disposition: A | Discharge status: Home / Self Care | Payer: Self-pay | Attending: Emergency Medicine | Admitting: Emergency Medicine

## 2015-02-01 ENCOUNTER — Encounter (HOSPITAL_COMMUNITY): Payer: Self-pay | Admitting: Emergency Medicine

## 2015-02-01 ENCOUNTER — Encounter (HOSPITAL_COMMUNITY)
Admission: EM | Disposition: A | Discharge status: Home / Self Care | Payer: Self-pay | Source: Home / Self Care | Attending: Emergency Medicine

## 2015-02-01 DIAGNOSIS — K222 Esophageal obstruction: Secondary | ICD-10-CM

## 2015-02-01 DIAGNOSIS — K2951 Unspecified chronic gastritis with bleeding: Secondary | ICD-10-CM

## 2015-02-01 DIAGNOSIS — R0789 Other chest pain: Secondary | ICD-10-CM

## 2015-02-01 DIAGNOSIS — R079 Chest pain, unspecified: Secondary | ICD-10-CM | POA: Insufficient documentation

## 2015-02-01 DIAGNOSIS — X58XXXA Exposure to other specified factors, initial encounter: Secondary | ICD-10-CM | POA: Insufficient documentation

## 2015-02-01 DIAGNOSIS — F419 Anxiety disorder, unspecified: Secondary | ICD-10-CM | POA: Insufficient documentation

## 2015-02-01 DIAGNOSIS — Y9289 Other specified places as the place of occurrence of the external cause: Secondary | ICD-10-CM | POA: Insufficient documentation

## 2015-02-01 DIAGNOSIS — F17210 Nicotine dependence, cigarettes, uncomplicated: Secondary | ICD-10-CM | POA: Insufficient documentation

## 2015-02-01 DIAGNOSIS — F329 Major depressive disorder, single episode, unspecified: Secondary | ICD-10-CM | POA: Insufficient documentation

## 2015-02-01 DIAGNOSIS — T18128A Food in esophagus causing other injury, initial encounter: Secondary | ICD-10-CM

## 2015-02-01 DIAGNOSIS — E785 Hyperlipidemia, unspecified: Secondary | ICD-10-CM | POA: Insufficient documentation

## 2015-02-01 DIAGNOSIS — Y9389 Activity, other specified: Secondary | ICD-10-CM | POA: Insufficient documentation

## 2015-02-01 DIAGNOSIS — Z79899 Other long term (current) drug therapy: Secondary | ICD-10-CM | POA: Insufficient documentation

## 2015-02-01 DIAGNOSIS — K208 Other esophagitis: Secondary | ICD-10-CM

## 2015-02-01 DIAGNOSIS — Y998 Other external cause status: Secondary | ICD-10-CM | POA: Insufficient documentation

## 2015-02-01 HISTORY — PX: ESOPHAGEAL DILATION: SHX303

## 2015-02-01 HISTORY — PX: ESOPHAGOGASTRODUODENOSCOPY: SHX5428

## 2015-02-01 LAB — BASIC METABOLIC PANEL
ANION GAP: 10 (ref 5–15)
BUN: 14 mg/dL (ref 6–20)
CALCIUM: 9.4 mg/dL (ref 8.9–10.3)
CO2: 25 mmol/L (ref 22–32)
Chloride: 108 mmol/L (ref 101–111)
Creatinine, Ser: 0.77 mg/dL (ref 0.44–1.00)
GFR calc Af Amer: >60 mL/min (ref >60)
GFR calc non Af Amer: >60 mL/min (ref >60)
GLUCOSE: 106 mg/dL — AB (ref 65–99)
Potassium: 3.4 mmol/L — ABNORMAL LOW (ref 3.5–5.1)
Sodium: 143 mmol/L (ref 135–145)

## 2015-02-01 LAB — CBC WITH DIFFERENTIAL/PLATELET
BASOS ABS: 0.1 10*3/uL (ref 0.0–0.1)
Basophils Relative: 1 %
Eosinophils Absolute: 0.4 10*3/uL (ref 0.0–0.7)
Eosinophils Relative: 4 %
HEMATOCRIT: 43.3 % (ref 36.0–46.0)
Hemoglobin: 15.2 g/dL — ABNORMAL HIGH (ref 12.0–15.0)
LYMPHS ABS: 3.6 10*3/uL (ref 0.7–4.0)
LYMPHS PCT: 34 %
MCH: 32 pg (ref 26.0–34.0)
MCHC: 35.1 g/dL (ref 30.0–36.0)
MCV: 91.2 fL (ref 78.0–100.0)
MONO ABS: 0.9 10*3/uL (ref 0.1–1.0)
MONOS PCT: 9 %
NEUTROS ABS: 5.8 10*3/uL (ref 1.7–7.7)
Neutrophils Relative %: 54 %
Platelets: 277 10*3/uL (ref 150–400)
RBC: 4.75 MIL/uL (ref 3.87–5.11)
RDW: 12.6 % (ref 11.5–15.5)
WBC: 10.7 10*3/uL — ABNORMAL HIGH (ref 4.0–10.5)

## 2015-02-01 LAB — TROPONIN I: Troponin I: <0.03 ng/mL (ref <0.031)

## 2015-02-01 SURGERY — EGD (ESOPHAGOGASTRODUODENOSCOPY)
Anesthesia: Moderate Sedation

## 2015-02-01 MED ORDER — MIDAZOLAM HCL 5 MG/5ML IJ SOLN
INTRAMUSCULAR | Status: AC
  Filled 2015-02-01: qty 5

## 2015-02-01 MED ORDER — SODIUM CHLORIDE 0.9 % IV SOLN: INTRAVENOUS | Status: DC

## 2015-02-01 MED ORDER — MEPERIDINE HCL 50 MG/ML IJ SOLN
INTRAMUSCULAR | Status: DC | PRN
  Administered 2015-02-01 (×2): 25 mg via INTRAVENOUS

## 2015-02-01 MED ORDER — NITROGLYCERIN 0.4 MG SL SUBL
0.4000 mg | SUBLINGUAL_TABLET | Freq: Once | SUBLINGUAL | Status: AC
  Administered 2015-02-01: 0.4 mg via SUBLINGUAL
  Filled 2015-02-01: qty 1

## 2015-02-01 MED ORDER — MIDAZOLAM HCL 5 MG/5ML IJ SOLN
INTRAMUSCULAR | Status: AC
  Filled 2015-02-01: qty 10

## 2015-02-01 MED ORDER — LORAZEPAM 2 MG/ML IJ SOLN
1.0000 mg | Freq: Once | INTRAMUSCULAR | Status: AC
  Administered 2015-02-01: 1 mg via INTRAVENOUS
  Filled 2015-02-01: qty 1

## 2015-02-01 MED ORDER — IOHEXOL 350 MG/ML SOLN
100.0000 mL | Freq: Once | INTRAVENOUS | Status: AC | PRN
  Administered 2015-02-01: 100 mL via INTRAVENOUS

## 2015-02-01 MED ORDER — SODIUM CHLORIDE 0.9 % IV BOLUS (SEPSIS)
1000.0000 mL | Freq: Once | INTRAVENOUS | Status: AC
  Administered 2015-02-01: 1000 mL via INTRAVENOUS

## 2015-02-01 MED ORDER — BUTAMBEN-TETRACAINE-BENZOCAINE 2-2-14 % EX AERO
INHALATION_SPRAY | CUTANEOUS | Status: DC | PRN
  Administered 2015-02-01: 2 via TOPICAL

## 2015-02-01 MED ORDER — MEPERIDINE HCL 50 MG/ML IJ SOLN
INTRAMUSCULAR | Status: AC
  Filled 2015-02-01: qty 1

## 2015-02-01 MED ORDER — STERILE WATER FOR IRRIGATION IR SOLN
Status: DC | PRN
  Administered 2015-02-01: 07:00:00

## 2015-02-01 MED ORDER — MIDAZOLAM HCL 5 MG/5ML IJ SOLN
INTRAMUSCULAR | Status: DC | PRN
  Administered 2015-02-01 (×2): 2 mg via INTRAVENOUS
  Administered 2015-02-01: 3 mg via INTRAVENOUS
  Administered 2015-02-01: 2 mg via INTRAVENOUS
  Administered 2015-02-01: 3 mg via INTRAVENOUS

## 2015-02-01 MED ORDER — PANTOPRAZOLE SODIUM 40 MG PO TBEC: 40.0000 mg | DELAYED_RELEASE_TABLET | Freq: Two times a day (BID) | ORAL | Status: DC

## 2015-02-01 MED ORDER — GLUCAGON HCL RDNA (DIAGNOSTIC) 1 MG IJ SOLR
1.0000 mg | Freq: Once | INTRAMUSCULAR | Status: AC
  Administered 2015-02-01: 1 mg via INTRAVENOUS
  Filled 2015-02-01: qty 1

## 2015-02-01 NOTE — ED Provider Notes (Signed)
CSN: [id number]     Arrival date & time 02/01/15  0219 History   First MD Initiated Contact with Patient 02/01/15 [phone]     Chief Complaint  Patient presents with  . Foreign Body    throat     (Consider location/radiation/quality/duration/timing/severity/associated sxs/prior Treatment) HPI Comments: Patient presents to the ER with complaints of feeling like she has a piece of meat stuck in her esophagus. Patient reports that this started around 7:30 PM. She ate a piece of beef and it did not feel like it went all the way down. Since then she has been unable to hear drink. She reports that her saliva keeps coming back up, cannot swallow it down. She reports persistent pain in the center of her chest, no difficulty breathing. She does report a history of gastroesophageal reflux, needs to use Tums frequently. She has had occasional symptoms of feeling like food does not go down correctly, but has never had an impaction or EGD.  Patient is a 55 y.o. female presenting with foreign body.  Foreign Body Associated symptoms: trouble swallowing     Past Medical History  Diagnosis Date  . Anxiety   . Depression   . Hyperlipemia   . Knee pain    Past Surgical History  Procedure Laterality Date  . Knee surgery    . Ankle surgery    . Tonsillectomy     Family History  Problem Relation Age of Onset  . Aortic dissection Mother   . Cancer Father     Renal Ce   Social History  Substance Use Topics  . Smoking status: Current Every Day Smoker -- 0.50 packs/day for 25 years    Types: Cigarettes  . Smokeless tobacco: Never Used  . Alcohol Use: Yes     Comment: At least 4 drinks a day.  Drinks beer.   OB History    No data available     Review of Systems  HENT: Positive for trouble swallowing.   Respiratory: Negative for shortness of breath.   Cardiovascular: Positive for chest pain.  All other systems reviewed and are negative.     Allergies  Review of patient's allergies  indicates no known allergies.  Home Medications   Prior to Admission medications   Medication Sig Start Date End Date Taking? Authorizing Provider  ALPRAZolam Prudy Feeler) 0.25 MG tablet Take 0.25 mg by mouth at bedtime as needed for anxiety.    Historical Provider, MD  Multiple Vitamin (MULTIVITAMIN WITH MINERALS) TABS tablet Take 1 tablet by mouth daily. May purchase over the counter to improve general health. 11/03/13   Thermon Leyland, NP  naproxen (NAPROSYN) 250 MG tablet Take 1 tablet (250 mg total) by mouth at bedtime. 11/03/13   Thermon Leyland, NP  pantoprazole (PROTONIX) 40 MG tablet Take 1 tablet (40 mg total) by mouth daily. 11/03/13   Thermon Leyland, NP  pravastatin (PRAVACHOL) 40 MG tablet Take 1 tablet (40 mg total) by mouth daily. 11/03/13   Thermon Leyland, NP  thiamine 100 MG tablet Take 1 tablet (100 mg total) by mouth daily. May purchase over the counter to improve general health. 11/03/13   Thermon Leyland, NP   BP 123/90 mmHg  Pulse 78  Temp(Src) 97.9 F (36.6 C) (Oral)  Resp 19  Ht  (1.626 m)  Wt 165 lb (74.844 kg)  BMI 28.31 kg/m2  SpO2 96% Physical Exam  Constitutional: She is oriented to person, place, and time. She appears well-developed  and well-nourished. No distress.  HENT:  Head: Normocephalic and atraumatic.  Right Ear: Hearing normal.  Left Ear: Hearing normal.  Nose: Nose normal.  Mouth/Throat: Oropharynx is clear and moist and mucous membranes are normal.  Eyes: Conjunctivae and EOM are normal. Pupils are equal, round, and reactive to light.  Neck: Normal range of motion. Neck supple.  Cardiovascular: Regular rhythm, S1 normal and S2 normal.  Exam reveals no gallop and no friction rub.   No murmur heard. Pulmonary/Chest: Effort normal and breath sounds normal. No respiratory distress. She exhibits no tenderness.  Abdominal: Soft. Normal appearance and bowel sounds are normal. There is no hepatosplenomegaly. There is no tenderness. There is no rebound, no  guarding, no tenderness at McBurney's point and negative Murphy's sign. No hernia.  Musculoskeletal: Normal range of motion.  Neurological: She is alert and oriented to person, place, and time. She has normal strength. No cranial nerve deficit or sensory deficit. Coordination normal. GCS eye subscore is 4. GCS verbal subscore is 5. GCS motor subscore is 6.  Skin: Skin is warm, dry and intact. No rash noted. No cyanosis.  Psychiatric: She has a normal mood and affect. Her speech is normal and behavior is normal. Thought content normal.  Nursing note and vitals reviewed.   ED Course  Procedures (including critical care time) Labs Review Labs Reviewed  CBC WITH DIFFERENTIAL/PLATELET - Abnormal; Notable for the following:    WBC 10.7 (*)    Hemoglobin 15.2 (*)    All other components within normal limits  BASIC METABOLIC PANEL - Abnormal; Notable for the following:    Potassium 3.4 (*)    Glucose, Bld 106 (*)    All other components within normal limits  TROPONIN I    Imaging Review Ct Angio Chest Aorta W/cm &/or Wo/cm  02/01/2015  CLINICAL DATA:  55 year old female with chest pain after eating a piece of meat EXAM: CT ANGIOGRAPHY CHEST, ABDOMEN AND PELVIS TECHNIQUE: Multidetector CT imaging through the chest, abdomen and pelvis was performed using the standard protocol during bolus administration of intravenous contrast. Multiplanar reconstructed images and MIPs were obtained and reviewed to evaluate the vascular anatomy. CONTRAST:  OMNIPAQUE IOHEXOL 350 MG/ML SOLN COMPARISON:  None. FINDINGS: CTA CHEST FINDINGS The lungs are clear. No pleural effusion or pneumothorax. The central airways are patent. The thoracic aorta appears unremarkable. No CT evidence of pulmonary embolism. There is no hilar or mediastinal adenopathy. There is no cardiomegaly or pericardial effusion. There is a 1.3 x 1.7 cm low attenuating density in the distal esophagus just above the diaphragmatic ileitis most  compatible with lodged food particle. The thyroid gland is unremarkable. There is no axillary adenopathy. The chest wall soft tissues appear unremarkable. The osseous structures are intact. Review of the MIP images confirms the above findings. CTA ABDOMEN AND PELVIS FINDINGS No intra-abdominal free air or free fluid. The liver, gallbladder, pancreas, spleen, adrenal glands appear unremarkable. There is no hydronephrosis on either side. The 1.7 cm left renal hypodense lesion is not well characterized but likely represents a cyst. Ultrasound may provide better evaluation. The visualized ureters and urinary bladder appear unremarkable. The uterus is grossly unremarkable. There are scattered colonic diverticula without active inflammatory changes. Moderate stool noted throughout the colon. No evidence of bowel obstruction or inflammation. Normal appendix. The abdominal aorta appears unremarkable. No portal venous gas identified. There is no adenopathy. The abdominal wall soft tissues appear unremarkable. Small intramuscular lipoma in the right paraspinous muscle at the level of  L1-L2. The osseous structures are intact. Review of the MIP images confirms the above findings. IMPRESSION: Small hypodense structure in the distal esophagus/gastroesophageal junction most compatible with an impacted food particle. No CT evidence of pulmonary embolism or aortic dissection. Electronically Signed   By: Elgie Collard M.D.   On: 02/01/2015 04:28   Ct Cta Abd/pel W/cm &/or W/o Cm  02/01/2015  CLINICAL DATA:  55 year old female with chest pain after eating a piece of meat EXAM: CT ANGIOGRAPHY CHEST, ABDOMEN AND PELVIS TECHNIQUE: Multidetector CT imaging through the chest, abdomen and pelvis was performed using the standard protocol during bolus administration of intravenous contrast. Multiplanar reconstructed images and MIPs were obtained and reviewed to evaluate the vascular anatomy. CONTRAST:  OMNIPAQUE IOHEXOL 350 MG/ML  SOLN COMPARISON:  None. FINDINGS: CTA CHEST FINDINGS The lungs are clear. No pleural effusion or pneumothorax. The central airways are patent. The thoracic aorta appears unremarkable. No CT evidence of pulmonary embolism. There is no hilar or mediastinal adenopathy. There is no cardiomegaly or pericardial effusion. There is a 1.3 x 1.7 cm low attenuating density in the distal esophagus just above the diaphragmatic ileitis most compatible with lodged food particle. The thyroid gland is unremarkable. There is no axillary adenopathy. The chest wall soft tissues appear unremarkable. The osseous structures are intact. Review of the MIP images confirms the above findings. CTA ABDOMEN AND PELVIS FINDINGS No intra-abdominal free air or free fluid. The liver, gallbladder, pancreas, spleen, adrenal glands appear unremarkable. There is no hydronephrosis on either side. The 1.7 cm left renal hypodense lesion is not well characterized but likely represents a cyst. Ultrasound may provide better evaluation. The visualized ureters and urinary bladder appear unremarkable. The uterus is grossly unremarkable. There are scattered colonic diverticula without active inflammatory changes. Moderate stool noted throughout the colon. No evidence of bowel obstruction or inflammation. Normal appendix. The abdominal aorta appears unremarkable. No portal venous gas identified. There is no adenopathy. The abdominal wall soft tissues appear unremarkable. Small intramuscular lipoma in the right paraspinous muscle at the level of L1-L2. The osseous structures are intact. Review of the MIP images confirms the above findings. IMPRESSION: Small hypodense structure in the distal esophagus/gastroesophageal junction most compatible with an impacted food particle. No CT evidence of pulmonary embolism or aortic dissection. Electronically Signed   By: Elgie Collard M.D.   On: 02/01/2015 04:28   I have personally reviewed and evaluated these images and  lab results as part of my medical decision-making.   EKG Interpretation   Date/Time:  Tuesday February 01 2015 02:27:36 EST Ventricular Rate:  92 PR Interval:  131 QRS Duration: 82 QT Interval:  363 QTC Calculation: 449 R Axis:   73 Text Interpretation:  Sinus rhythm Probable left atrial enlargement  Otherwise within normal limits No significant change since last tracing  Confirmed by Jasminemarie Sherrard  MD, Tyshea Imel [phone]) on 02/01/2015 2:48:38 AM      MDM   Final diagnoses:  Chest pain  Esophageal obstruction due to food impaction    Patient presents to the emergency department for evaluation of chest pain and sensation that something is stuck in her throat. Patient reports that she was eating meat around the time when the symptoms began, but she cannot definitively say that she felt the food suddenly get stuck. Patient also tells me that her mother complained of similar symptoms and then suddenly died of aortic dissection. Because of this, CT angiography was performed and does show evidence of impaction and obstruction of  the esophagus without any aortic abnormality. Patient has been administered Ativan, glucagon, nitroglycerin without improvement. She will require endoscopy.    Gilda Crease, MD 02/01/15 [phone]

## 2015-02-01 NOTE — Op Note (Addendum)
EGD PROCEDURE REPORT  PATIENT:  Jasmine Santos  MR#:  [id number] Birthdate:  1960/12/06, 55 y.o., female Endoscopist:  Dr. Malissa Hippo, MD Referred By:  Dr. Jaci Carrel, MD  Procedure Date: 02/01/2015  Procedure:   EGD with ED  Indications:  Patient is 55 year old Caucasian female who presents with signs and symptoms of esophageal food impaction. She has chronic GERD. She is on pantoprazole usually 3 times a week and takes on when necessary basis. She was treated in emergency room with IV glucagon and nitroglycerin without symptomatic relief. She is undergoing therapeutic EGD.            Informed Consent:  The risks, benefits, alternatives & imponderables which include, but are not limited to, bleeding, infection, perforation, drug reaction and potential missed lesion have been reviewed.  The potential for biopsy, lesion removal, esophageal dilation, etc. have also been discussed.  Questions have been answered.  All parties agreeable.  Please see history & physical in medical record for more information.  Medications:  Demerol 50 mg IV Versed 12 mg IV Cetacaine spray topically for oropharyngeal anesthesia  First dose administered at 7:15 AM Last dose administered at 7:25 AM  Scope out 7:29 AM  Description of procedure:  The endoscope was introduced through the mouth and advanced to the second portion of the duodenum without difficulty or limitations. The mucosal surfaces were surveyed very carefully during advancement of the scope and upon withdrawal.  Findings:  Esophagus:  Mucosa of the proximal and middle third was normal. Ring noted at GE junction with erythema and edema to mucosa above and below it felt to be secondary to physical injury from food impaction.  GEJ:  38 cm Stomach:  Food debris noted in the stomach which was felt to be foreign body that she had passed spontaneously. Stomach distended very well with insufflation. Folds in the proximal stomach are normal.  Examination mucosa gastric body was normal. Scattered prepyloric erosions noted along with 3 mm ulcer with tonic-clonic in the center but no active bleeding. Pyloric channel was patent. Angularis fundus and cardia were unremarkable. Duodenum:  Normal bulbar and post bulbar mucosa.  Therapeutic/Diagnostic Maneuvers Performed:   Esophageal dilation was performed with balloon dilator. Balloon.it was passed through the scope. Guidewire was pushed into gastric lumen. Balloon.was positioned across the ring and insufflated to a diameter of 15 mm; then to 16.5 mm and finally to 18 mm. It was maintained for a few minutes and then passed distally. Ring was noted to have been disrupted.  Complications:  None  EBL: Minimal  Impression: Foreign body spontaneously passed into the stomach. Prominent ring at GE junction with focal changes of esophagitis felt to be secondary to food impaction. Erosive gastritis with small prepyloric ulcer. Esophageal dilation performed with balloon dilator to 18 mm and ring disrupted.  Recommendations:  Standard instructions given. Increase pantoprazole to 40 mg by mouth twice a day. H. pylori serology. Office visit in 3 months.  Kealy Lewter U  02/01/2015  7:34 AM  CC: Dr. Dwana Melena, MD & Dr. Bonnetta Barry ref. provider found

## 2015-02-01 NOTE — ED Notes (Signed)
Onset 1930, after eating meat, difficulty swallowing,  Unsure if meat is stuck in throat, can not swallow, pt continue to try to vomit, feels pressure in upper chest, throat area.  Pt had drank 2 beer prior to eating.  Tried to take xanax to help her calm down and feel like it dissolved and spit it out.

## 2015-02-01 NOTE — Discharge Instructions (Signed)
Discontinue naproxen if possible; if you cannot always take with food or snack. Increase pantoprazole to 40 mg by mouth 30 minutes before breakfast and evening meal daily. No driving for 24 hours. Physician will call with results of blood tests. Office visit in 3 months.    PATIENT INSTRUCTIONS POST-ANESTHESIA  IMMEDIATELY FOLLOWING SURGERY:  Do not drive or operate machinery for the first twenty four hours after surgery.  Do not make any important decisions for twenty four hours after surgery or while taking narcotic pain medications or sedatives.  If you develop intractable nausea and vomiting or a severe headache please notify your doctor immediately.  FOLLOW-UP:  Please make an appointment with your surgeon as instructed. You do not need to follow up with anesthesia unless specifically instructed to do so.  WOUND CARE INSTRUCTIONS (if applicable):  Keep a dry clean dressing on the anesthesia/puncture wound site if there is drainage.  Once the wound has quit draining you may leave it open to air.  Generally you should leave the bandage intact for twenty four hours unless there is drainage.  If the epidural site drains for more than 36-48 hours please call the anesthesia department.  QUESTIONS?:  Please feel free to call your physician or the hospital operator if you have any questions, and they will be happy to assist you.

## 2015-02-01 NOTE — ED Notes (Signed)
Pt being transported to day surg

## 2015-02-01 NOTE — H&P (Signed)
Jasmine Santos is an 55 y.o. female.   Chief Complaint: Jasmine Santos is here for EGD foreign body removal and possible esophageal dilation. HPI: Jasmine Santos is 55 year old Caucasian female who presented to emergency room early this morning with chest pain and inability to swallow liquids or saliva. Symptoms began after she was eating steak. While in emergency room she had a CT and was negative for aortic dissection. She was not able to get any relief with nitroglycerin and glucagon. She gives history of frequent heartburn. She takes Tums 3-4 times a week. She states she had last episode 4 years ago and she was able to regurgitate food bolus for relief. She denies abdominal pain and melena.  Past Medical History  Diagnosis Date  . Anxiety   . Depression   . Hyperlipemia   . Knee pain     Past Surgical History  Procedure Laterality Date  . Knee surgery    . Ankle surgery    . Tonsillectomy      Family History  Problem Relation Age of Onset  . Aortic dissection Mother   . Cancer Father     Renal Ce   Social History:  reports that she has been smoking Cigarettes.  She has a 12.5 pack-year smoking history. She has never used smokeless tobacco. She reports that she drinks alcohol. She reports that she uses illicit drugs.  Allergies: No Known Allergies  Medications Prior to Admission  Medication Sig Dispense Refill  . ALPRAZolam (XANAX) 0.25 MG tablet Take 0.25 mg by mouth at bedtime as needed for anxiety.    . Multiple Vitamin (MULTIVITAMIN WITH MINERALS) TABS tablet Take 1 tablet by mouth daily. May purchase over the counter to improve general health.    . naproxen (NAPROSYN) 250 MG tablet Take 1 tablet (250 mg total) by mouth at bedtime. 30 tablet 0  . pantoprazole (PROTONIX) 40 MG tablet Take 1 tablet (40 mg total) by mouth daily. 30 tablet 0  . pravastatin (PRAVACHOL) 40 MG tablet Take 1 tablet (40 mg total) by mouth daily. 30 tablet   . thiamine 100 MG tablet Take 1 tablet (100 mg total) by  mouth daily. May purchase over the counter to improve general health.      Results for orders placed or performed during the hospital encounter of 02/01/15 (from the past 48 hour(s))  CBC with Differential/Platelet     Status: Abnormal   Collection Time: 02/01/15  2:45 AM  Result Value Ref Range   WBC 10.7 (H) 4.0 - 10.5 K/uL   RBC 4.75 3.87 - 5.11 MIL/uL   Hemoglobin 15.2 (H) 12.0 - 15.0 g/dL   HCT 43.3 36.0 - 46.0 %   MCV 91.2 78.0 - 100.0 fL   MCH 32.0 26.0 - 34.0 pg   MCHC 35.1 30.0 - 36.0 g/dL   RDW 12.6 11.5 - 15.5 %   Platelets 277 150 - 400 K/uL   Neutrophils Relative % 54 %   Neutro Abs 5.8 1.7 - 7.7 K/uL   Lymphocytes Relative 34 %   Lymphs Abs 3.6 0.7 - 4.0 K/uL   Monocytes Relative 9 %   Monocytes Absolute 0.9 0.1 - 1.0 K/uL   Eosinophils Relative 4 %   Eosinophils Absolute 0.4 0.0 - 0.7 K/uL   Basophils Relative 1 %   Basophils Absolute 0.1 0.0 - 0.1 K/uL  Basic metabolic panel     Status: Abnormal   Collection Time: 02/01/15  2:45 AM  Result Value Ref Range   Sodium  143 135 - 145 mmol/L   Potassium 3.4 (L) 3.5 - 5.1 mmol/L   Chloride 108 101 - 111 mmol/L   CO2 25 22 - 32 mmol/L   Glucose, Bld 106 (H) 65 - 99 mg/dL   BUN 14 6 - 20 mg/dL   Creatinine, Ser 0.77 0.44 - 1.00 mg/dL   Calcium 9.4 8.9 - 10.3 mg/dL   GFR calc non Af Amer >60 >60 mL/min   GFR calc Af Amer >60 >60 mL/min    Comment: (NOTE) The eGFR has been calculated using the CKD EPI equation. This calculation has not been validated in all clinical situations. eGFR's persistently <60 mL/min signify possible Chronic Kidney Disease.    Anion gap 10 5 - 15  Troponin I     Status: None   Collection Time: 02/01/15  2:45 AM  Result Value Ref Range   Troponin I <0.03 <0.031 ng/mL    Comment:        NO INDICATION OF MYOCARDIAL INJURY.      ROS  Blood pressure 125/89, pulse 82, temperature 97.9 F (36.6 C), temperature source Oral, resp. rate 20, height 5' 4"  (1.626 m), weight 165 lb (74.844  kg), SpO2 95 %. Physical Exam  Constitutional: She appears well-developed and well-nourished.  HENT:  Mouth/Throat: Oropharynx is clear and moist.  Eyes: Conjunctivae are normal. No scleral icterus.  Neck: No thyromegaly present.  Cardiovascular: Normal rate, regular rhythm and normal heart sounds.   No murmur heard. Respiratory: Effort normal and breath sounds normal.  GI: Soft. She exhibits no distension and no mass. There is no tenderness.  Musculoskeletal: She exhibits no edema.  Lymphadenopathy:    She has no cervical adenopathy.  Neurological: She is alert.  Skin: Skin is warm and dry.     Assessment/Plan Foreign body esophagus. EGD with foreign body removal and possible esophageal dilation.  REHMAN,NAJEEB U 02/01/2015, 7:08 AM

## 2015-02-02 LAB — H. PYLORI ANTIBODY, IGG

## 2015-02-07 ENCOUNTER — Encounter (HOSPITAL_COMMUNITY): Payer: Self-pay | Admitting: Internal Medicine

## 2015-08-26 ENCOUNTER — Ambulatory Visit: Payer: Self-pay | Admitting: Obstetrics & Gynecology

## 2015-09-15 ENCOUNTER — Encounter: Payer: Self-pay | Admitting: Obstetrics & Gynecology

## 2015-09-15 ENCOUNTER — Ambulatory Visit (INDEPENDENT_AMBULATORY_CARE_PROVIDER_SITE_OTHER): Payer: 59 | Admitting: Obstetrics & Gynecology

## 2015-09-15 VITALS — BP 130/90 | HR 84 | Ht 64.0 in | Wt 153.0 lb

## 2015-09-15 DIAGNOSIS — N951 Menopausal and female climacteric states: Secondary | ICD-10-CM | POA: Diagnosis not present

## 2015-09-15 DIAGNOSIS — F419 Anxiety disorder, unspecified: Secondary | ICD-10-CM | POA: Diagnosis not present

## 2015-09-15 MED ORDER — ESTRADIOL 0.025 MG/24HR TD PTTW: 1.0000 | MEDICATED_PATCH | TRANSDERMAL | 12 refills | Status: DC

## 2015-09-15 MED ORDER — PROGESTERONE MICRONIZED 200 MG PO CAPS: ORAL_CAPSULE | ORAL | 11 refills | Status: DC

## 2015-09-15 MED ORDER — ALPRAZOLAM 0.25 MG PO TABS: 0.2500 mg | ORAL_TABLET | Freq: Every evening | ORAL | 5 refills | Status: DC | PRN
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
## 2015-12-12 ENCOUNTER — Other Ambulatory Visit: Payer: 59 | Admitting: Obstetrics & Gynecology

## 2015-12-15 ENCOUNTER — Encounter: Payer: Self-pay | Admitting: Obstetrics & Gynecology

## 2015-12-15 ENCOUNTER — Other Ambulatory Visit (HOSPITAL_COMMUNITY)
Admission: RE | Admit: 2015-12-15 | Discharge: 2015-12-15 | Disposition: A | Discharge status: Home / Self Care | Payer: 59 | Source: Ambulatory Visit | Attending: Obstetrics & Gynecology | Admitting: Obstetrics & Gynecology

## 2015-12-15 ENCOUNTER — Ambulatory Visit (INDEPENDENT_AMBULATORY_CARE_PROVIDER_SITE_OTHER): Payer: 59 | Admitting: Obstetrics & Gynecology

## 2015-12-15 VITALS — BP 140/80 | HR 78 | Ht 64.0 in | Wt 150.0 lb

## 2015-12-15 DIAGNOSIS — Z1212 Encounter for screening for malignant neoplasm of rectum: Secondary | ICD-10-CM

## 2015-12-15 DIAGNOSIS — Z01419 Encounter for gynecological examination (general) (routine) without abnormal findings: Secondary | ICD-10-CM | POA: Diagnosis not present

## 2015-12-15 DIAGNOSIS — Z1211 Encounter for screening for malignant neoplasm of colon: Secondary | ICD-10-CM

## 2015-12-15 MED ORDER — ALPRAZOLAM 0.25 MG PO TABS: 0.2500 mg | ORAL_TABLET | Freq: Every evening | ORAL | 5 refills | Status: DC | PRN

## 2015-12-15 NOTE — Addendum Note (Signed)
Addended by: Lazaro ArmsEURE, LUTHER H on: 12/15/2015 04:25 PM   Modules accepted: Orders
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
## 2015-12-19 LAB — CYTOLOGY - PAP: Diagnosis: NEGATIVE

## 2016-01-18 ENCOUNTER — Telehealth: Payer: Self-pay | Admitting: Obstetrics & Gynecology

## 2016-01-19 NOTE — Telephone Encounter (Signed)
Left message on VM for patient to return our call if she still needs a referral for mammogram.

## 2016-01-25 ENCOUNTER — Telehealth: Payer: Self-pay | Admitting: Obstetrics & Gynecology

## 2016-01-25 NOTE — Telephone Encounter (Signed)
Patient called stating she needs a referral for her mammogram per [facility]. Can I put that in for you?

## 2016-01-26 ENCOUNTER — Other Ambulatory Visit: Payer: Self-pay | Admitting: *Deleted

## 2016-01-26 DIAGNOSIS — R928 Other abnormal and inconclusive findings on diagnostic imaging of breast: Secondary | ICD-10-CM

## 2016-01-26 NOTE — Telephone Encounter (Signed)
sure

## 2016-01-26 NOTE — Telephone Encounter (Signed)
Left message on voicemail that order was placed. She should receive a call to schedule an appointment

## 2016-02-07 ENCOUNTER — Other Ambulatory Visit: Payer: Self-pay | Admitting: Obstetrics & Gynecology

## 2016-02-07 ENCOUNTER — Telehealth: Payer: Self-pay | Admitting: *Deleted

## 2016-02-07 DIAGNOSIS — N6320 Unspecified lump in the left breast, unspecified quadrant: Secondary | ICD-10-CM

## 2016-02-07 DIAGNOSIS — Z09 Encounter for follow-up examination after completed treatment for conditions other than malignant neoplasm: Secondary | ICD-10-CM

## 2016-02-07 NOTE — Telephone Encounter (Signed)
Patient called stating she has not received a call from [facility] radiology for her mammogram. I informed the patient that I spoke with Victorino DikeJennifer in Radiology and the order was put in. Pt stated she would call them. I informed her to call me if she had any problems.

## 2016-02-21 ENCOUNTER — Ambulatory Visit (HOSPITAL_COMMUNITY)
Admission: RE | Admit: 2016-02-21 | Discharge: 2016-02-21 | Disposition: A | Discharge status: Home / Self Care | Payer: 59 | Source: Ambulatory Visit | Attending: Obstetrics & Gynecology | Admitting: Obstetrics & Gynecology

## 2016-02-21 ENCOUNTER — Encounter (HOSPITAL_COMMUNITY): Payer: Self-pay

## 2016-02-21 DIAGNOSIS — Z09 Encounter for follow-up examination after completed treatment for conditions other than malignant neoplasm: Secondary | ICD-10-CM

## 2016-02-21 DIAGNOSIS — N6320 Unspecified lump in the left breast, unspecified quadrant: Secondary | ICD-10-CM

## 2016-02-21 HISTORY — DX: Encounter for follow-up examination after completed treatment for conditions other than malignant neoplasm: Z09

## 2016-02-22 ENCOUNTER — Telehealth: Payer: Self-pay | Admitting: Obstetrics & Gynecology

## 2016-02-22 DIAGNOSIS — Z1211 Encounter for screening for malignant neoplasm of colon: Secondary | ICD-10-CM

## 2016-02-27 NOTE — Telephone Encounter (Signed)
Pt states she would like referral sent to Dr Patty Sermonsehman's office.

## 2016-02-28 ENCOUNTER — Encounter (INDEPENDENT_AMBULATORY_CARE_PROVIDER_SITE_OTHER): Payer: Self-pay | Admitting: *Deleted

## 2016-03-12 ENCOUNTER — Ambulatory Visit (INDEPENDENT_AMBULATORY_CARE_PROVIDER_SITE_OTHER): Payer: Self-pay | Admitting: Internal Medicine

## 2016-03-28 ENCOUNTER — Other Ambulatory Visit: Payer: Self-pay | Admitting: Obstetrics & Gynecology

## 2016-06-27 ENCOUNTER — Other Ambulatory Visit: Payer: Self-pay | Admitting: Obstetrics & Gynecology

## 2017-05-10 DIAGNOSIS — F1020 Alcohol dependence, uncomplicated: Secondary | ICD-10-CM | POA: Diagnosis not present

## 2017-05-10 DIAGNOSIS — F419 Anxiety disorder, unspecified: Secondary | ICD-10-CM | POA: Diagnosis not present

## 2017-05-10 DIAGNOSIS — F332 Major depressive disorder, recurrent severe without psychotic features: Secondary | ICD-10-CM | POA: Diagnosis not present

## 2017-05-10 DIAGNOSIS — R410 Disorientation, unspecified: Secondary | ICD-10-CM | POA: Diagnosis not present

## 2017-05-10 DIAGNOSIS — F329 Major depressive disorder, single episode, unspecified: Secondary | ICD-10-CM | POA: Diagnosis not present

## 2017-05-10 DIAGNOSIS — Z720 Tobacco use: Secondary | ICD-10-CM | POA: Diagnosis not present

## 2017-05-10 DIAGNOSIS — R4182 Altered mental status, unspecified: Secondary | ICD-10-CM | POA: Diagnosis not present

## 2017-05-10 DIAGNOSIS — T50904A Poisoning by unspecified drugs, medicaments and biological substances, undetermined, initial encounter: Secondary | ICD-10-CM | POA: Diagnosis not present

## 2017-05-10 DIAGNOSIS — T450X1A Poisoning by antiallergic and antiemetic drugs, accidental (unintentional), initial encounter: Secondary | ICD-10-CM | POA: Diagnosis not present

## 2017-05-10 DIAGNOSIS — F17200 Nicotine dependence, unspecified, uncomplicated: Secondary | ICD-10-CM | POA: Diagnosis not present

## 2017-05-13 DIAGNOSIS — F329 Major depressive disorder, single episode, unspecified: Secondary | ICD-10-CM | POA: Diagnosis not present

## 2017-10-28 ENCOUNTER — Other Ambulatory Visit (HOSPITAL_COMMUNITY): Payer: Self-pay | Admitting: Family Medicine

## 2017-10-28 DIAGNOSIS — Z1231 Encounter for screening mammogram for malignant neoplasm of breast: Secondary | ICD-10-CM

## 2017-11-04 ENCOUNTER — Ambulatory Visit (HOSPITAL_COMMUNITY)
Admission: RE | Admit: 2017-11-04 | Discharge: 2017-11-04 | Disposition: A | Discharge status: Home / Self Care | Payer: BLUE CROSS/BLUE SHIELD | Source: Ambulatory Visit | Attending: Family Medicine | Admitting: Family Medicine

## 2017-11-04 DIAGNOSIS — Z1231 Encounter for screening mammogram for malignant neoplasm of breast: Secondary | ICD-10-CM | POA: Insufficient documentation

## 2017-11-19 DIAGNOSIS — H52223 Regular astigmatism, bilateral: Secondary | ICD-10-CM | POA: Diagnosis not present

## 2017-11-19 DIAGNOSIS — H5203 Hypermetropia, bilateral: Secondary | ICD-10-CM | POA: Diagnosis not present

## 2017-11-19 DIAGNOSIS — H524 Presbyopia: Secondary | ICD-10-CM | POA: Diagnosis not present

## 2017-12-26 DIAGNOSIS — Z23 Encounter for immunization: Secondary | ICD-10-CM | POA: Diagnosis not present

## 2018-06-23 DIAGNOSIS — M79674 Pain in right toe(s): Secondary | ICD-10-CM | POA: Diagnosis not present

## 2018-06-23 DIAGNOSIS — M2011 Hallux valgus (acquired), right foot: Secondary | ICD-10-CM | POA: Diagnosis not present

## 2018-06-23 DIAGNOSIS — M19071 Primary osteoarthritis, right ankle and foot: Secondary | ICD-10-CM | POA: Diagnosis not present

## 2018-07-21 DIAGNOSIS — M79674 Pain in right toe(s): Secondary | ICD-10-CM | POA: Diagnosis not present

## 2018-07-21 DIAGNOSIS — M2011 Hallux valgus (acquired), right foot: Secondary | ICD-10-CM | POA: Diagnosis not present

## 2018-07-21 DIAGNOSIS — M19071 Primary osteoarthritis, right ankle and foot: Secondary | ICD-10-CM | POA: Diagnosis not present

## 2018-09-05 DIAGNOSIS — I110 Hypertensive heart disease with heart failure: Secondary | ICD-10-CM | POA: Diagnosis not present

## 2018-09-05 DIAGNOSIS — R5383 Other fatigue: Secondary | ICD-10-CM | POA: Diagnosis not present

## 2018-09-05 DIAGNOSIS — E559 Vitamin D deficiency, unspecified: Secondary | ICD-10-CM | POA: Diagnosis not present

## 2018-09-05 DIAGNOSIS — E7849 Other hyperlipidemia: Secondary | ICD-10-CM | POA: Diagnosis not present

## 2018-09-17 DIAGNOSIS — F418 Other specified anxiety disorders: Secondary | ICD-10-CM | POA: Diagnosis not present

## 2018-09-17 DIAGNOSIS — E7849 Other hyperlipidemia: Secondary | ICD-10-CM | POA: Diagnosis not present

## 2018-10-30 DIAGNOSIS — Z23 Encounter for immunization: Secondary | ICD-10-CM | POA: Diagnosis not present

## 2018-11-04 ENCOUNTER — Other Ambulatory Visit (HOSPITAL_COMMUNITY): Payer: Self-pay | Admitting: Family Medicine

## 2018-11-04 DIAGNOSIS — Z1231 Encounter for screening mammogram for malignant neoplasm of breast: Secondary | ICD-10-CM

## 2018-11-13 ENCOUNTER — Ambulatory Visit (HOSPITAL_COMMUNITY)
Admission: RE | Admit: 2018-11-13 | Discharge: 2018-11-13 | Disposition: A | Discharge status: Home / Self Care | Payer: BC Managed Care – PPO | Source: Ambulatory Visit | Attending: Family Medicine | Admitting: Family Medicine

## 2018-11-13 ENCOUNTER — Other Ambulatory Visit: Payer: Self-pay

## 2018-11-13 DIAGNOSIS — Z1231 Encounter for screening mammogram for malignant neoplasm of breast: Secondary | ICD-10-CM | POA: Insufficient documentation

## 2019-11-04 ENCOUNTER — Other Ambulatory Visit (HOSPITAL_COMMUNITY): Payer: Self-pay | Admitting: Family Medicine

## 2019-11-04 DIAGNOSIS — Z1231 Encounter for screening mammogram for malignant neoplasm of breast: Secondary | ICD-10-CM

## 2019-11-19 ENCOUNTER — Other Ambulatory Visit: Payer: Self-pay

## 2019-11-19 ENCOUNTER — Ambulatory Visit (HOSPITAL_COMMUNITY)
Admission: RE | Admit: 2019-11-19 | Discharge: 2019-11-19 | Disposition: A | Discharge status: Home / Self Care | Payer: BC Managed Care – PPO | Source: Ambulatory Visit | Attending: Family Medicine | Admitting: Family Medicine

## 2019-11-19 DIAGNOSIS — Z1231 Encounter for screening mammogram for malignant neoplasm of breast: Secondary | ICD-10-CM | POA: Diagnosis not present

## 2019-12-31 ENCOUNTER — Ambulatory Visit (INDEPENDENT_AMBULATORY_CARE_PROVIDER_SITE_OTHER): Payer: BC Managed Care – PPO | Admitting: Internal Medicine

## 2019-12-31 ENCOUNTER — Encounter: Payer: Self-pay | Admitting: Internal Medicine

## 2019-12-31 ENCOUNTER — Other Ambulatory Visit: Payer: Self-pay

## 2019-12-31 VITALS — BP 132/82 | HR 91 | Temp 98.3°F | Resp 18 | Ht 64.0 in | Wt 167.1 lb

## 2019-12-31 DIAGNOSIS — H6123 Impacted cerumen, bilateral: Secondary | ICD-10-CM

## 2019-12-31 DIAGNOSIS — M1990 Unspecified osteoarthritis, unspecified site: Secondary | ICD-10-CM | POA: Diagnosis not present

## 2019-12-31 DIAGNOSIS — J309 Allergic rhinitis, unspecified: Secondary | ICD-10-CM

## 2019-12-31 DIAGNOSIS — Z7689 Persons encountering health services in other specified circumstances: Secondary | ICD-10-CM | POA: Diagnosis not present

## 2019-12-31 DIAGNOSIS — Z23 Encounter for immunization: Secondary | ICD-10-CM | POA: Diagnosis not present

## 2019-12-31 DIAGNOSIS — F339 Major depressive disorder, recurrent, unspecified: Secondary | ICD-10-CM | POA: Diagnosis not present

## 2019-12-31 DIAGNOSIS — F1010 Alcohol abuse, uncomplicated: Secondary | ICD-10-CM

## 2019-12-31 DIAGNOSIS — Z720 Tobacco use: Secondary | ICD-10-CM

## 2019-12-31 DIAGNOSIS — Z1211 Encounter for screening for malignant neoplasm of colon: Secondary | ICD-10-CM

## 2019-12-31 DIAGNOSIS — Z1231 Encounter for screening mammogram for malignant neoplasm of breast: Secondary | ICD-10-CM

## 2019-12-31 MED ORDER — FLUTICASONE PROPIONATE 50 MCG/ACT NA SUSP: 2.0000 | Freq: Every day | NASAL | 6 refills | Status: DC

## 2019-12-31 MED ORDER — MONTELUKAST SODIUM 10 MG PO TABS: 10.0000 mg | ORAL_TABLET | Freq: Every day | ORAL | 3 refills | Status: DC

## 2019-12-31 NOTE — Assessment & Plan Note (Signed)
Debrox ear drops Advised to avoid using sharp objects for cleaning purposes

## 2019-12-31 NOTE — Assessment & Plan Note (Signed)
Asked about quitting: confirms that she currently smokes cigarettes. 2-4 cigarettes/day, but admits passive smoke exposure as well. Advise to quit smoking: Educated about QUITTING to reduce the risk of cancer, cardio and cerebrovascular disease. Assess willingness: Unwilling to quit at this time, but is working on cutting back. Assist with counseling and pharmacotherapy: Counseled for 5 minutes and literature provided. Arrange for follow up: Follow up in 3 months and continue to offer help. 

## 2019-12-31 NOTE — Assessment & Plan Note (Signed)
Care established Previous chart reviewed History and medications reviewed with the patient 

## 2019-12-31 NOTE — Patient Instructions (Addendum)
Please start taking Singulair and use Flonase for nasal congestion.  Please continue taking Seroquel and Pravastatin as prescribed.  Please try to cut down alcohol to 1 drink/day at most. Please try to quit smoking and avoid passive exposure to smoke.  Please follow low salt diet and perform moderate exercise/walking at least 150 mins/week.  Please use debrox ear drops for ear wax.  Okay to use Meclizine 12.5 mg up to twice daily for dizziness.

## 2019-12-31 NOTE — Assessment & Plan Note (Signed)
Reports taking about 4-6 drinks/day and more on weekends Advised to cut down alcohol intake, patient expressed understanding.

## 2019-12-31 NOTE — Assessment & Plan Note (Addendum)
Reports h/o postnasal drip and sinus problems for many years Started Singulair Flonase PRN Avoid dust and pollen exposure Quit smoking and avoid passive exposure if possible.
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
## 2019-12-31 NOTE — Assessment & Plan Note (Signed)
Continue Seroquel Has tried Trazodone in the past, did not tolerate.

## 2019-12-31 NOTE — Assessment & Plan Note (Signed)
Tylenol PRN Continue simple knee exercises

## 2020-01-01 LAB — CBC WITH DIFFERENTIAL/PLATELET
Basophils Absolute: 0.1 10*3/uL (ref 0.0–0.2)
Basos: 1 %
EOS (ABSOLUTE): 0.3 10*3/uL (ref 0.0–0.4)
Eos: 4 %
Hematocrit: 43.8 % (ref 34.0–46.6)
Hemoglobin: 15.4 g/dL (ref 11.1–15.9)
Immature Grans (Abs): 0 10*3/uL (ref 0.0–0.1)
Immature Granulocytes: 0 %
Lymphocytes Absolute: 2.6 10*3/uL (ref 0.7–3.1)
Lymphs: 30 %
MCH: 32.9 pg (ref 26.6–33.0)
MCHC: 35.2 g/dL (ref 31.5–35.7)
MCV: 94 fL (ref 79–97)
Monocytes Absolute: 0.8 10*3/uL (ref 0.1–0.9)
Monocytes: 9 %
Neutrophils Absolute: 5 10*3/uL (ref 1.4–7.0)
Neutrophils: 56 %
Platelets: 250 10*3/uL (ref 150–450)
RBC: 4.68 x10E6/uL (ref 3.77–5.28)
RDW: 13.1 % (ref 11.7–15.4)
WBC: 8.9 10*3/uL (ref 3.4–10.8)

## 2020-01-01 LAB — CMP14+EGFR
ALT: 20 IU/L (ref 0–32)
AST: 23 IU/L (ref 0–40)
Albumin/Globulin Ratio: 1.6 (ref 1.2–2.2)
Albumin: 4.3 g/dL (ref 3.8–4.9)
Alkaline Phosphatase: 96 IU/L (ref 44–121)
BUN/Creatinine Ratio: 16 (ref 12–28)
BUN: 16 mg/dL (ref 8–27)
Bilirubin Total: 0.4 mg/dL (ref 0.0–1.2)
CO2: 24 mmol/L (ref 20–29)
Calcium: 9.6 mg/dL (ref 8.7–10.3)
Chloride: 106 mmol/L (ref 96–106)
Creatinine, Ser: 0.98 mg/dL (ref 0.57–1.00)
GFR calc Af Amer: 73 mL/min/{1.73_m2} (ref >59)
GFR calc non Af Amer: 63 mL/min/{1.73_m2} (ref >59)
Globulin, Total: 2.7 g/dL (ref 1.5–4.5)
Glucose: 88 mg/dL (ref 65–99)
Potassium: 4.7 mmol/L (ref 3.5–5.2)
Sodium: 143 mmol/L (ref 134–144)
Total Protein: 7 g/dL (ref 6.0–8.5)

## 2020-01-01 LAB — HEMOGLOBIN A1C
Est. average glucose Bld gHb Est-mCnc: 114 mg/dL
Hgb A1c MFr Bld: 5.6 % (ref 4.8–5.6)

## 2020-01-01 LAB — LIPID PANEL
Chol/HDL Ratio: 4.2 ratio (ref 0.0–4.4)
Cholesterol, Total: 229 mg/dL — ABNORMAL HIGH (ref 100–199)
HDL: 54 mg/dL (ref >39)
LDL Chol Calc (NIH): 109 mg/dL — ABNORMAL HIGH (ref 0–99)
Triglycerides: 389 mg/dL — ABNORMAL HIGH (ref 0–149)
VLDL Cholesterol Cal: 66 mg/dL — ABNORMAL HIGH (ref 5–40)

## 2020-01-01 LAB — TSH+FREE T4
Free T4: 1.07 ng/dL (ref 0.82–1.77)
TSH: 1.04 u[IU]/mL (ref 0.450–4.500)

## 2020-01-01 LAB — VITAMIN D 25 HYDROXY (VIT D DEFICIENCY, FRACTURES): Vit D, 25-Hydroxy: 32.5 ng/mL (ref 30.0–100.0)

## 2020-01-14 DIAGNOSIS — Z1211 Encounter for screening for malignant neoplasm of colon: Secondary | ICD-10-CM | POA: Diagnosis not present

## 2020-01-14 LAB — COLOGUARD: Cologuard: NEGATIVE

## 2020-01-25 LAB — COLOGUARD: Cologuard: NEGATIVE

## 2020-01-25 LAB — EXTERNAL GENERIC LAB PROCEDURE: COLOGUARD: NEGATIVE

## 2020-01-27 ENCOUNTER — Encounter: Payer: Self-pay | Admitting: *Deleted

## 2020-01-29 ENCOUNTER — Other Ambulatory Visit: Payer: BC Managed Care – PPO

## 2020-01-29 DIAGNOSIS — Z20822 Contact with and (suspected) exposure to covid-19: Secondary | ICD-10-CM

## 2020-01-31 LAB — SARS-COV-2, NAA 2 DAY TAT

## 2020-01-31 LAB — SPECIMEN STATUS REPORT

## 2020-01-31 LAB — NOVEL CORONAVIRUS, NAA: SARS-CoV-2, NAA: NOT DETECTED

## 2020-03-30 ENCOUNTER — Ambulatory Visit: Payer: BC Managed Care – PPO | Admitting: Internal Medicine

## 2020-07-08 IMAGING — MG DIGITAL SCREENING BILAT W/ TOMO
6 of 10 series · 6 of 30 positions shown · non-contrast
Comparison: Previous exam(s).

CLINICAL DATA: Screening.

EXAM:
DIGITAL SCREENING BILATERAL MAMMOGRAM WITH TOMO AND CAD

[L CC synth-2D]
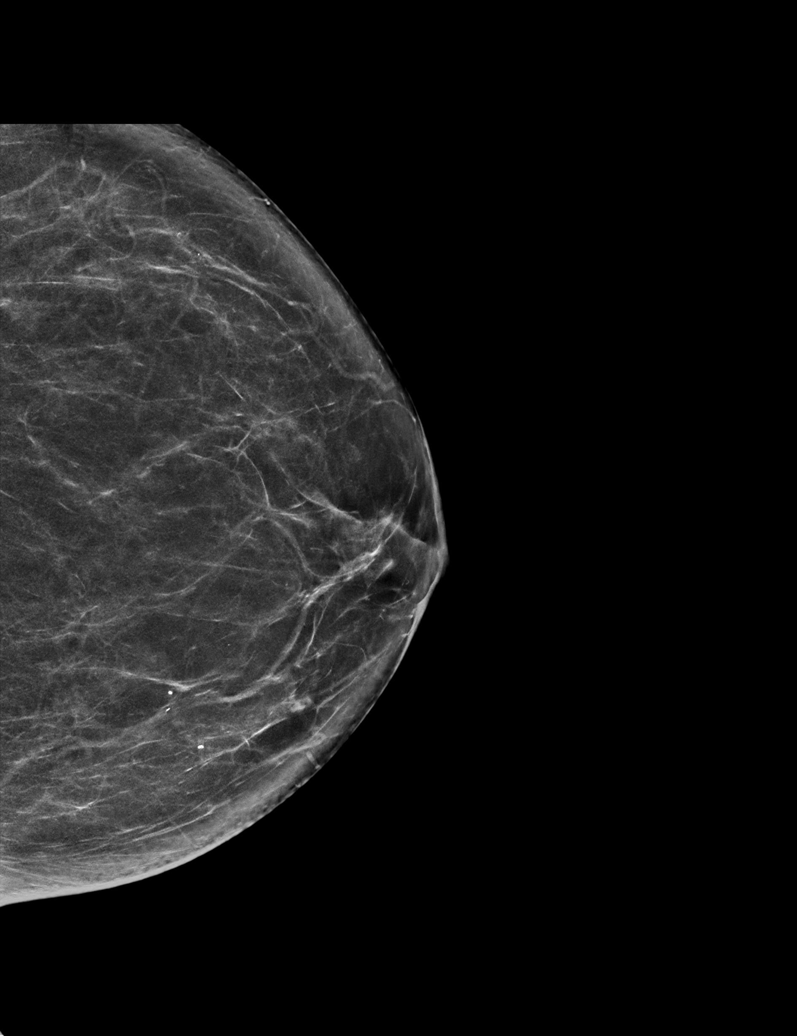

[R MLO synth-2D (1 of 2)]
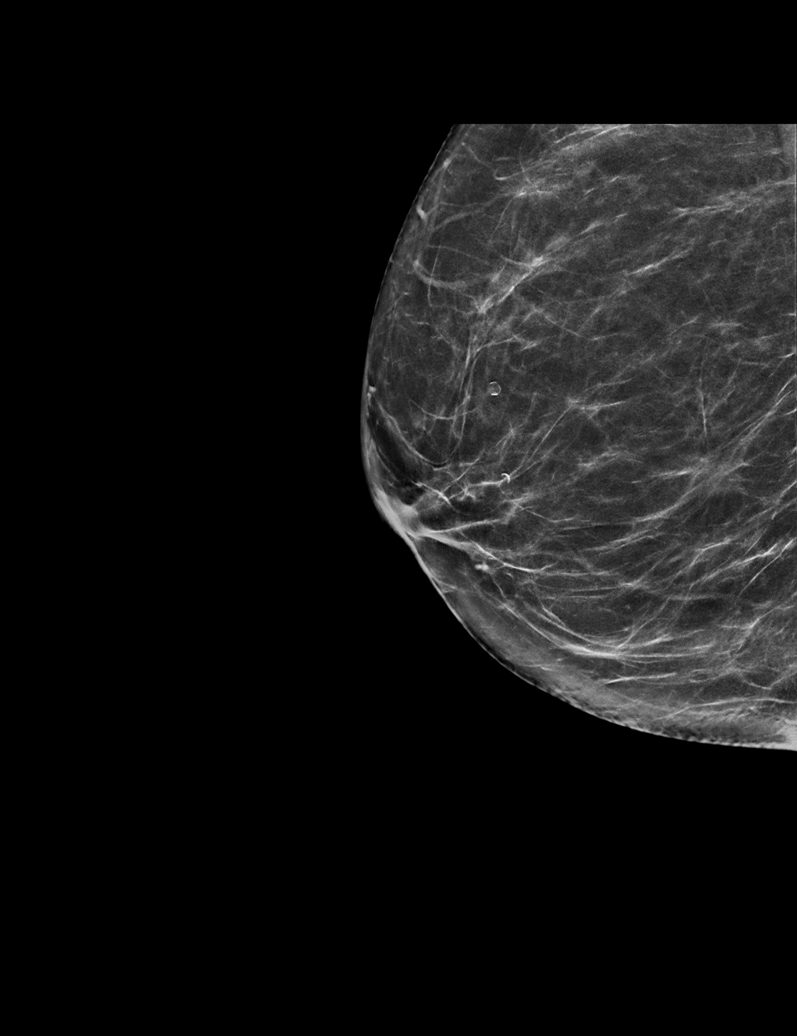

[R CC synth-2D]
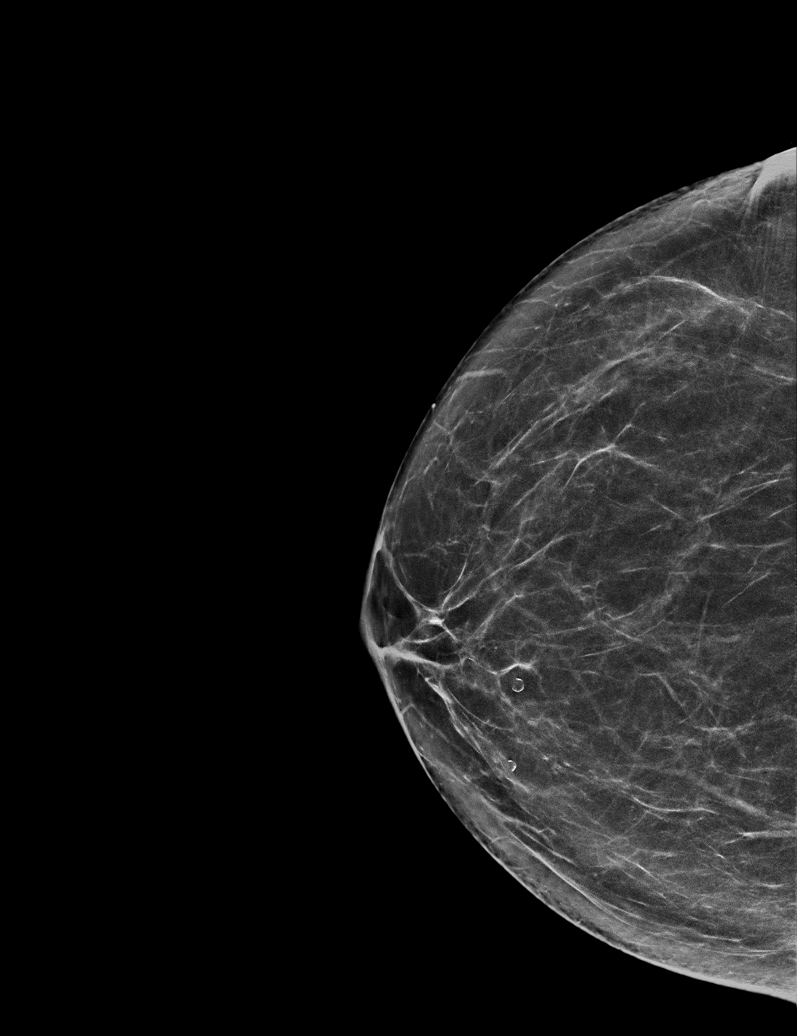

[R MLO synth-2D (2 of 2)]
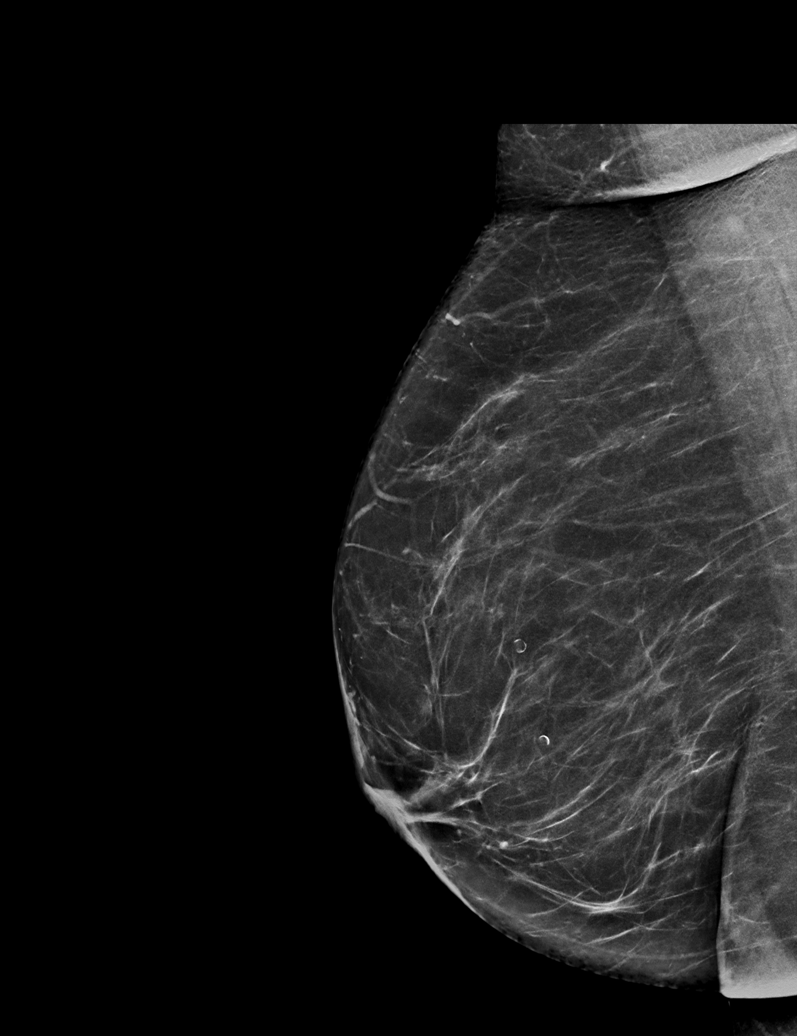

[L MLO synth-2D]
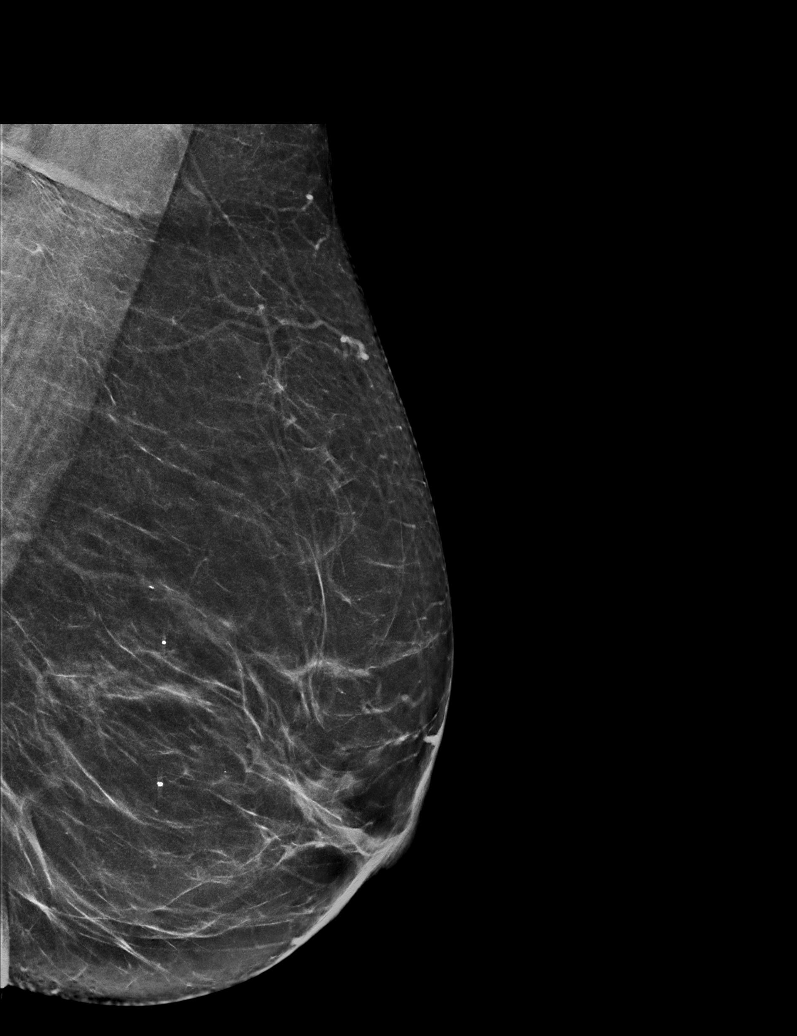

[R MLO tomo · tomo slice 33/66.0]
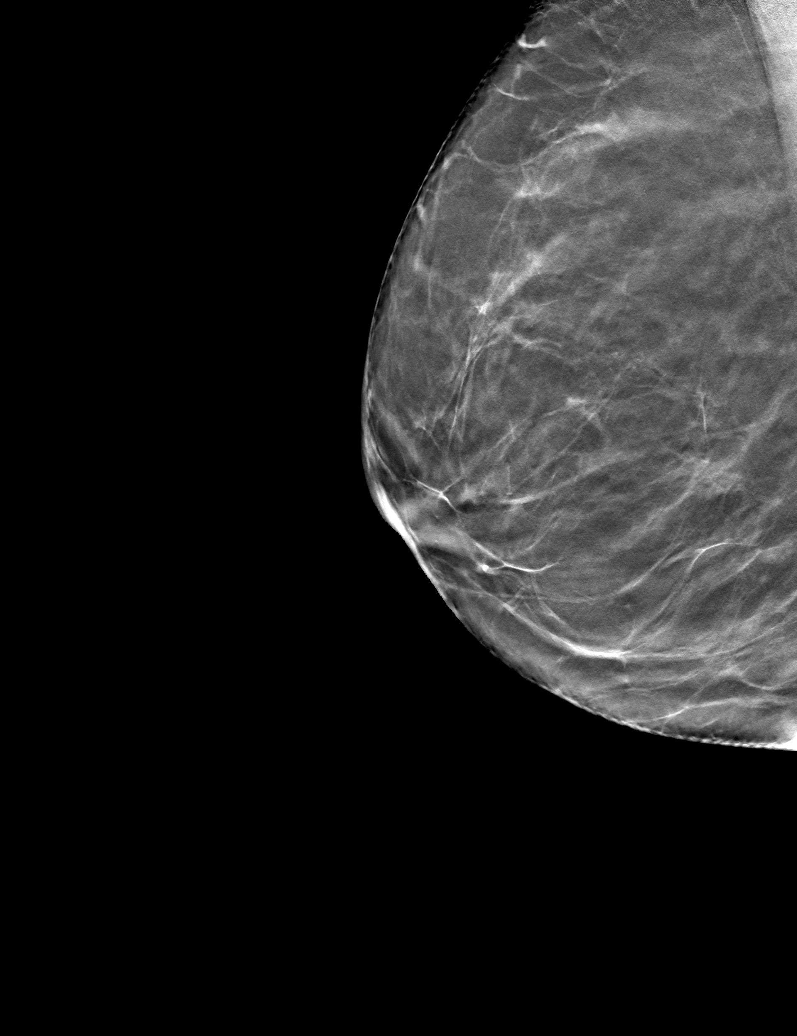

[6 of 30 positions shown; findings below may reference images not displayed]

ACR Breast Density Category b: There are scattered areas of
fibroglandular density.
FINDINGS: There are no findings suspicious for malignancy. Images were
processed with CAD.
IMPRESSION: No mammographic evidence of malignancy. A result letter of this
screening mammogram will be mailed directly to the patient.

RECOMMENDATION:
Screening mammogram in one year. (Code:[id number])

BI-RADS CATEGORY  1: Negative.

## 2020-08-25 ENCOUNTER — Ambulatory Visit
Admission: EM | Admit: 2020-08-25 | Discharge: 2020-08-25 | Disposition: A | Discharge status: Home / Self Care | Payer: Commercial Managed Care - PPO | Attending: Emergency Medicine | Admitting: Emergency Medicine

## 2020-08-25 ENCOUNTER — Other Ambulatory Visit: Payer: Self-pay

## 2020-08-25 ENCOUNTER — Encounter: Payer: Self-pay | Admitting: Emergency Medicine

## 2020-08-25 DIAGNOSIS — K047 Periapical abscess without sinus: Secondary | ICD-10-CM | POA: Diagnosis not present

## 2020-08-25 MED ORDER — AMOXICILLIN-POT CLAVULANATE 875-125 MG PO TABS: 1.0000 | ORAL_TABLET | Freq: Two times a day (BID) | ORAL | 0 refills | Status: AC

## 2020-08-25 NOTE — ED Triage Notes (Signed)
Right sided teeth, ear and head pain.  Hx of root canal 2 months ago.  Pain x 2 days.  States she gets this problem often
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
## 2020-08-25 NOTE — Discharge Instructions (Addendum)
Augmentin for possible dental infection Recommend soft diet until evaluated by dentist Maintain oral hygiene care Follow up with dentist as soon as possible for further evaluation and treatment  Return or go to the ED if you have any new or worsening symptoms such as fever, chills, difficulty swallowing, painful swallowing, oral or neck swelling, nausea, vomiting, chest pain, SOB, etc..Marland Kitchen

## 2020-11-21 ENCOUNTER — Ambulatory Visit (HOSPITAL_COMMUNITY): Payer: BC Managed Care – PPO

## 2020-12-05 ENCOUNTER — Other Ambulatory Visit: Payer: Self-pay

## 2020-12-05 ENCOUNTER — Ambulatory Visit (HOSPITAL_COMMUNITY)
Admission: RE | Admit: 2020-12-05 | Discharge: 2020-12-05 | Disposition: A | Discharge status: Home / Self Care | Payer: Commercial Managed Care - PPO | Source: Ambulatory Visit | Attending: Internal Medicine | Admitting: Internal Medicine

## 2020-12-05 DIAGNOSIS — Z1231 Encounter for screening mammogram for malignant neoplasm of breast: Secondary | ICD-10-CM | POA: Diagnosis present

## 2021-07-27 ENCOUNTER — Encounter: Payer: Commercial Managed Care - PPO | Admitting: Internal Medicine

## 2021-07-27 ENCOUNTER — Other Ambulatory Visit: Payer: Self-pay | Admitting: *Deleted

## 2021-07-27 MED ORDER — PRAVASTATIN SODIUM 40 MG PO TABS: 40.0000 mg | ORAL_TABLET | Freq: Every day | ORAL | Status: DC

## 2021-07-28 NOTE — Progress Notes (Signed)
ERRONEOUS ENCOUNTER PLEASE DISREGARD

## 2021-08-02 ENCOUNTER — Other Ambulatory Visit: Payer: Self-pay | Admitting: *Deleted

## 2021-08-02 ENCOUNTER — Telehealth: Payer: Self-pay

## 2021-08-02 ENCOUNTER — Other Ambulatory Visit: Payer: Self-pay | Admitting: Internal Medicine

## 2021-08-02 MED ORDER — PRAVASTATIN SODIUM 40 MG PO TABS: 40.0000 mg | ORAL_TABLET | Freq: Every day | ORAL | 0 refills | Status: DC

## 2021-08-02 NOTE — Telephone Encounter (Signed)
Patient medication sent.

## 2021-08-02 NOTE — Telephone Encounter (Signed)
Patient called pharmacy has not yet received this medication.   pravastatin (PRAVACHOL) 40 MG  [facility] [address]

## 2021-08-14 ENCOUNTER — Ambulatory Visit (INDEPENDENT_AMBULATORY_CARE_PROVIDER_SITE_OTHER): Payer: Commercial Managed Care - PPO | Admitting: Internal Medicine

## 2021-08-14 ENCOUNTER — Encounter: Payer: Self-pay | Admitting: Internal Medicine

## 2021-08-14 VITALS — BP 132/82 | HR 79 | Resp 18 | Ht 64.0 in | Wt 167.4 lb

## 2021-08-14 DIAGNOSIS — J410 Simple chronic bronchitis: Secondary | ICD-10-CM

## 2021-08-14 DIAGNOSIS — Z1159 Encounter for screening for other viral diseases: Secondary | ICD-10-CM

## 2021-08-14 DIAGNOSIS — F331 Major depressive disorder, recurrent, moderate: Secondary | ICD-10-CM

## 2021-08-14 DIAGNOSIS — R7303 Prediabetes: Secondary | ICD-10-CM

## 2021-08-14 DIAGNOSIS — Z720 Tobacco use: Secondary | ICD-10-CM

## 2021-08-14 DIAGNOSIS — E538 Deficiency of other specified B group vitamins: Secondary | ICD-10-CM

## 2021-08-14 DIAGNOSIS — F1024 Alcohol dependence with alcohol-induced mood disorder: Secondary | ICD-10-CM | POA: Diagnosis not present

## 2021-08-14 DIAGNOSIS — Z114 Encounter for screening for human immunodeficiency virus [HIV]: Secondary | ICD-10-CM

## 2021-08-14 DIAGNOSIS — E559 Vitamin D deficiency, unspecified: Secondary | ICD-10-CM

## 2021-08-14 DIAGNOSIS — E782 Mixed hyperlipidemia: Secondary | ICD-10-CM

## 2021-08-14 MED ORDER — ALBUTEROL SULFATE HFA 108 (90 BASE) MCG/ACT IN AERS: 2.0000 | INHALATION_SPRAY | Freq: Four times a day (QID) | RESPIRATORY_TRACT | 2 refills | Status: DC | PRN

## 2021-08-14 MED ORDER — QUETIAPINE FUMARATE 50 MG PO TABS: 50.0000 mg | ORAL_TABLET | Freq: Every day | ORAL | 0 refills | Status: DC

## 2021-08-14 NOTE — Patient Instructions (Addendum)
Please continue taking Seroquel for insomnia.  Please use Albuterol for shortness of breath or wheezing.  Please try to cut down to quit smoking.  Please cut down alcohol use, no more than 1 drink per day.  Please get fasting blood tests done before the next visit.
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
## 2021-08-17 ENCOUNTER — Encounter: Payer: Self-pay | Admitting: Internal Medicine

## 2021-08-17 DIAGNOSIS — F1024 Alcohol dependence with alcohol-induced mood disorder: Secondary | ICD-10-CM | POA: Insufficient documentation

## 2021-08-17 DIAGNOSIS — J410 Simple chronic bronchitis: Secondary | ICD-10-CM | POA: Insufficient documentation

## 2021-08-17 NOTE — Assessment & Plan Note (Signed)
Asked about quitting: confirms that she currently smokes cigarettes. 2-4 cigarettes/day, but admits passive smoke exposure as well. Advise to quit smoking: Educated about QUITTING to reduce the risk of cancer, cardio and cerebrovascular disease. Assess willingness: Unwilling to quit at this time, but is working on cutting back. Assist with counseling and pharmacotherapy: Counseled for 5 minutes and literature provided. Arrange for follow up: Follow up in 3 months and continue to offer help.

## 2021-08-17 NOTE — Assessment & Plan Note (Signed)
Chronic, intermittent dyspnea, worse on exertion Needs to cut down to-> quit smoking Albuterol inhaler as needed for dyspnea or wheezing 
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
## 2021-08-17 NOTE — Assessment & Plan Note (Signed)
Reports taking about 6 drinks/day and more on weekends Her insomnia, depression and chronic fatigue likely due to alcohol abuse Advised to cut down alcohol intake, patient expressed understanding. Offered medication and support group referral, she denies.

## 2021-09-01 LAB — CMP14+EGFR
ALT: 19 IU/L (ref 0–32)
AST: 22 IU/L (ref 0–40)
Albumin/Globulin Ratio: 2.1 (ref 1.2–2.2)
Albumin: 4.7 g/dL (ref 3.9–4.9)
Alkaline Phosphatase: 89 IU/L (ref 44–121)
BUN/Creatinine Ratio: 17 (ref 12–28)
BUN: 12 mg/dL (ref 8–27)
Bilirubin Total: 0.4 mg/dL (ref 0.0–1.2)
CO2: 22 mmol/L (ref 20–29)
Calcium: 10 mg/dL (ref 8.7–10.3)
Chloride: 102 mmol/L (ref 96–106)
Creatinine, Ser: 0.7 mg/dL (ref 0.57–1.00)
Globulin, Total: 2.2 g/dL (ref 1.5–4.5)
Glucose: 97 mg/dL (ref 70–99)
Potassium: 4.5 mmol/L (ref 3.5–5.2)
Sodium: 142 mmol/L (ref 134–144)
Total Protein: 6.9 g/dL (ref 6.0–8.5)
eGFR: 98 mL/min/{1.73_m2} (ref >59)

## 2021-09-01 LAB — LIPID PANEL
Chol/HDL Ratio: 4.1 ratio (ref 0.0–4.4)
Cholesterol, Total: 227 mg/dL — ABNORMAL HIGH (ref 100–199)
HDL: 55 mg/dL (ref >39)
LDL Chol Calc (NIH): 120 mg/dL — ABNORMAL HIGH (ref 0–99)
Triglycerides: 300 mg/dL — ABNORMAL HIGH (ref 0–149)
VLDL Cholesterol Cal: 52 mg/dL — ABNORMAL HIGH (ref 5–40)

## 2021-09-01 LAB — CBC WITH DIFFERENTIAL/PLATELET
Basophils Absolute: 0.1 10*3/uL (ref 0.0–0.2)
Basos: 1 %
EOS (ABSOLUTE): 0.3 10*3/uL (ref 0.0–0.4)
Eos: 4 %
Hematocrit: 46.9 % — ABNORMAL HIGH (ref 34.0–46.6)
Hemoglobin: 15.6 g/dL (ref 11.1–15.9)
Immature Grans (Abs): 0 10*3/uL (ref 0.0–0.1)
Immature Granulocytes: 0 %
Lymphocytes Absolute: 4.2 10*3/uL — ABNORMAL HIGH (ref 0.7–3.1)
Lymphs: 47 %
MCH: 31.5 pg (ref 26.6–33.0)
MCHC: 33.3 g/dL (ref 31.5–35.7)
MCV: 95 fL (ref 79–97)
Monocytes Absolute: 0.7 10*3/uL (ref 0.1–0.9)
Monocytes: 8 %
Neutrophils Absolute: 3.5 10*3/uL (ref 1.4–7.0)
Neutrophils: 40 %
Platelets: 259 10*3/uL (ref 150–450)
RBC: 4.96 x10E6/uL (ref 3.77–5.28)
RDW: 13.8 % (ref 11.7–15.4)
WBC: 8.9 10*3/uL (ref 3.4–10.8)

## 2021-09-01 LAB — HEMOGLOBIN A1C
Est. average glucose Bld gHb Est-mCnc: 120 mg/dL
Hgb A1c MFr Bld: 5.8 % — ABNORMAL HIGH (ref 4.8–5.6)

## 2021-09-01 LAB — VITAMIN B12: Vitamin B-12: 298 pg/mL (ref 232–1245)

## 2021-09-01 LAB — HEPATITIS C ANTIBODY: Hep C Virus Ab: NONREACTIVE

## 2021-09-01 LAB — VITAMIN D 25 HYDROXY (VIT D DEFICIENCY, FRACTURES): Vit D, 25-Hydroxy: 16.8 ng/mL — ABNORMAL LOW (ref 30.0–100.0)

## 2021-09-01 LAB — TSH: TSH: 3.73 u[IU]/mL (ref 0.450–4.500)

## 2021-09-01 LAB — HIV ANTIBODY (ROUTINE TESTING W REFLEX): HIV Screen 4th Generation wRfx: NONREACTIVE

## 2021-09-08 ENCOUNTER — Other Ambulatory Visit: Payer: Self-pay | Admitting: Internal Medicine

## 2021-09-08 DIAGNOSIS — F331 Major depressive disorder, recurrent, moderate: Secondary | ICD-10-CM

## 2021-09-11 ENCOUNTER — Encounter: Payer: Self-pay | Admitting: Internal Medicine

## 2021-09-11 ENCOUNTER — Ambulatory Visit (INDEPENDENT_AMBULATORY_CARE_PROVIDER_SITE_OTHER): Payer: Commercial Managed Care - PPO | Admitting: Internal Medicine

## 2021-09-11 VITALS — BP 138/84 | HR 92 | Resp 18 | Ht 64.0 in | Wt 170.8 lb

## 2021-09-11 DIAGNOSIS — E782 Mixed hyperlipidemia: Secondary | ICD-10-CM

## 2021-09-11 DIAGNOSIS — F1024 Alcohol dependence with alcohol-induced mood disorder: Secondary | ICD-10-CM

## 2021-09-11 DIAGNOSIS — Z0001 Encounter for general adult medical examination with abnormal findings: Secondary | ICD-10-CM | POA: Diagnosis not present

## 2021-09-11 DIAGNOSIS — F331 Major depressive disorder, recurrent, moderate: Secondary | ICD-10-CM

## 2021-09-11 DIAGNOSIS — E559 Vitamin D deficiency, unspecified: Secondary | ICD-10-CM | POA: Diagnosis not present

## 2021-09-11 DIAGNOSIS — E538 Deficiency of other specified B group vitamins: Secondary | ICD-10-CM

## 2021-09-11 DIAGNOSIS — F411 Generalized anxiety disorder: Secondary | ICD-10-CM | POA: Insufficient documentation

## 2021-09-11 MED ORDER — CYANOCOBALAMIN 1000 MCG/ML IJ SOLN
1000.0000 ug | Freq: Once | INTRAMUSCULAR | Status: AC
  Administered 2021-09-11: 1000 ug via INTRAMUSCULAR

## 2021-09-11 MED ORDER — VITAMIN D (ERGOCALCIFEROL) 1.25 MG (50000 UNIT) PO CAPS: 50000.0000 [IU] | ORAL_CAPSULE | ORAL | 1 refills | Status: DC

## 2021-09-11 MED ORDER — QUETIAPINE FUMARATE 50 MG PO TABS: 50.0000 mg | ORAL_TABLET | Freq: Every day | ORAL | 3 refills | Status: DC

## 2021-09-11 MED ORDER — HYDROXYZINE PAMOATE 25 MG PO CAPS: 25.0000 mg | ORAL_CAPSULE | Freq: Two times a day (BID) | ORAL | 2 refills | Status: DC | PRN
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
## 2021-09-11 NOTE — Assessment & Plan Note (Signed)
Started Vitamin D 50,000 IU qw 

## 2021-09-11 NOTE — Patient Instructions (Signed)
Please take Hydroxyzine as needed for anxiety.  Please take Vitamin D as prescribed.  Please continue taking other medications as prescribed.  Please continue your efforts to cut down alcohol and smoking.

## 2021-09-11 NOTE — Assessment & Plan Note (Signed)
Likely due to her sister's health concerns Added Vistaril PRN for anxiety On Seroquel for resistant depression

## 2021-09-11 NOTE — Assessment & Plan Note (Signed)
Physical exam as documented. Fasting blood tests reviewed and discussed with the patient in detail. 

## 2021-09-11 NOTE — Assessment & Plan Note (Signed)
Vitamin B12 injection today Advised to take B complex supplement

## 2021-09-11 NOTE — Assessment & Plan Note (Signed)
Lipid profile reviewed Advised to follow low cholesterol diet for now 

## 2021-09-11 NOTE — Assessment & Plan Note (Signed)
Reports taking about 3 drinks/day and more on weekends, has cut down from 6 drinks/day Her insomnia, depression and chronic fatigue likely due to alcohol abuse Advised to cut down alcohol intake, patient expressed understanding. Offered medication and support group referral, she denies. 
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
## 2021-10-11 ENCOUNTER — Other Ambulatory Visit: Payer: Self-pay | Admitting: Internal Medicine

## 2021-10-11 DIAGNOSIS — F331 Major depressive disorder, recurrent, moderate: Secondary | ICD-10-CM

## 2021-11-02 ENCOUNTER — Other Ambulatory Visit (HOSPITAL_COMMUNITY): Payer: Self-pay | Admitting: Internal Medicine

## 2021-11-02 ENCOUNTER — Encounter: Payer: Self-pay | Admitting: Internal Medicine

## 2021-11-02 DIAGNOSIS — Z1231 Encounter for screening mammogram for malignant neoplasm of breast: Secondary | ICD-10-CM

## 2021-12-11 ENCOUNTER — Ambulatory Visit (HOSPITAL_COMMUNITY)
Admission: RE | Admit: 2021-12-11 | Discharge: 2021-12-11 | Disposition: A | Discharge status: Home / Self Care | Payer: Commercial Managed Care - PPO | Source: Ambulatory Visit | Attending: Internal Medicine | Admitting: Internal Medicine

## 2021-12-11 DIAGNOSIS — Z1231 Encounter for screening mammogram for malignant neoplasm of breast: Secondary | ICD-10-CM | POA: Insufficient documentation

## 2021-12-13 ENCOUNTER — Other Ambulatory Visit: Payer: Self-pay | Admitting: Internal Medicine

## 2021-12-13 DIAGNOSIS — F411 Generalized anxiety disorder: Secondary | ICD-10-CM

## 2022-01-11 ENCOUNTER — Ambulatory Visit: Payer: Commercial Managed Care - PPO | Admitting: Internal Medicine

## 2022-01-25 ENCOUNTER — Other Ambulatory Visit: Payer: Self-pay | Admitting: Internal Medicine

## 2022-01-27 ENCOUNTER — Encounter: Payer: Self-pay | Admitting: Internal Medicine

## 2022-01-30 ENCOUNTER — Encounter: Payer: Self-pay | Admitting: Internal Medicine

## 2022-01-30 ENCOUNTER — Ambulatory Visit (INDEPENDENT_AMBULATORY_CARE_PROVIDER_SITE_OTHER): Payer: Commercial Managed Care - PPO | Admitting: Internal Medicine

## 2022-01-30 VITALS — BP 138/82 | HR 87 | Ht 64.0 in | Wt 172.0 lb

## 2022-01-30 DIAGNOSIS — Z23 Encounter for immunization: Secondary | ICD-10-CM | POA: Diagnosis not present

## 2022-01-30 DIAGNOSIS — J410 Simple chronic bronchitis: Secondary | ICD-10-CM | POA: Diagnosis not present

## 2022-01-30 DIAGNOSIS — F1024 Alcohol dependence with alcohol-induced mood disorder: Secondary | ICD-10-CM

## 2022-01-30 DIAGNOSIS — F411 Generalized anxiety disorder: Secondary | ICD-10-CM

## 2022-01-30 DIAGNOSIS — Z720 Tobacco use: Secondary | ICD-10-CM

## 2022-01-30 DIAGNOSIS — F331 Major depressive disorder, recurrent, moderate: Secondary | ICD-10-CM

## 2022-01-30 MED ORDER — QUETIAPINE FUMARATE 50 MG PO TABS: 50.0000 mg | ORAL_TABLET | Freq: Every day | ORAL | 3 refills | Status: DC

## 2022-01-30 MED ORDER — HYDROXYZINE PAMOATE 25 MG PO CAPS: 25.0000 mg | ORAL_CAPSULE | Freq: Two times a day (BID) | ORAL | 3 refills | Status: DC | PRN

## 2022-01-30 NOTE — Assessment & Plan Note (Addendum)
Asked about quitting: confirms that she currently smokes cigarettes. 8 cigarettes/day, but admits passive smoke exposure as well. Advise to quit smoking: Educated about QUITTING to reduce the risk of cancer, cardio and cerebrovascular disease. Assess willingness: Unwilling to quit at this time, but is working on cutting back. Assist with counseling and pharmacotherapy: Counseled for 5 minutes and literature provided. Arrange for follow up: Follow up in 3 months and continue to offer help.

## 2022-01-30 NOTE — Assessment & Plan Note (Signed)
Reports taking about 3 drinks/day and more on weekends, has cut down from 6 drinks/day Her insomnia, depression and chronic fatigue likely due to alcohol abuse Advised to cut down alcohol intake, patient expressed understanding. Offered medication and support group referral, she denies.

## 2022-01-30 NOTE — Assessment & Plan Note (Addendum)
Likely due to her sister's health concerns Refilled Vistaril PRN for anxiety On Seroquel for resistant depression

## 2022-01-30 NOTE — Assessment & Plan Note (Signed)
Chronic, intermittent dyspnea, worse on exertion Needs to cut down to-> quit smoking Albuterol inhaler as needed for dyspnea or wheezing
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
## 2022-01-30 NOTE — Patient Instructions (Addendum)
Please continue taking medications as prescribed.  Please continue to cut down -> quit smoking.  Please take Vistaril as needed for anxiety.  Please join a support group or counseling sessions as discussed.
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
## 2022-01-31 ENCOUNTER — Encounter: Payer: Self-pay | Admitting: Internal Medicine

## 2022-02-06 ENCOUNTER — Other Ambulatory Visit: Payer: Self-pay | Admitting: Internal Medicine

## 2022-02-06 DIAGNOSIS — F331 Major depressive disorder, recurrent, moderate: Secondary | ICD-10-CM

## 2022-02-25 ENCOUNTER — Other Ambulatory Visit: Payer: Self-pay | Admitting: Internal Medicine

## 2022-02-26 ENCOUNTER — Other Ambulatory Visit: Payer: Self-pay | Admitting: Internal Medicine

## 2022-02-28 ENCOUNTER — Other Ambulatory Visit: Payer: Self-pay | Admitting: Internal Medicine

## 2022-02-28 DIAGNOSIS — E559 Vitamin D deficiency, unspecified: Secondary | ICD-10-CM

## 2022-03-24 ENCOUNTER — Encounter: Payer: Self-pay | Admitting: Internal Medicine

## 2022-03-29 ENCOUNTER — Other Ambulatory Visit: Payer: Self-pay | Admitting: Internal Medicine

## 2022-03-29 DIAGNOSIS — F331 Major depressive disorder, recurrent, moderate: Secondary | ICD-10-CM

## 2022-05-08 ENCOUNTER — Encounter: Payer: Self-pay | Admitting: Internal Medicine

## 2022-05-22 ENCOUNTER — Ambulatory Visit (INDEPENDENT_AMBULATORY_CARE_PROVIDER_SITE_OTHER): Payer: Commercial Managed Care - PPO | Admitting: Internal Medicine

## 2022-05-22 ENCOUNTER — Encounter: Payer: Self-pay | Admitting: Internal Medicine

## 2022-05-22 VITALS — BP 138/70 | HR 86 | Ht 64.0 in | Wt 171.2 lb

## 2022-05-22 DIAGNOSIS — L821 Other seborrheic keratosis: Secondary | ICD-10-CM

## 2022-05-22 DIAGNOSIS — Z720 Tobacco use: Secondary | ICD-10-CM | POA: Diagnosis not present

## 2022-05-22 DIAGNOSIS — F1024 Alcohol dependence with alcohol-induced mood disorder: Secondary | ICD-10-CM

## 2022-05-22 DIAGNOSIS — R7303 Prediabetes: Secondary | ICD-10-CM | POA: Insufficient documentation

## 2022-05-22 DIAGNOSIS — J410 Simple chronic bronchitis: Secondary | ICD-10-CM | POA: Diagnosis not present

## 2022-05-22 DIAGNOSIS — F411 Generalized anxiety disorder: Secondary | ICD-10-CM

## 2022-05-22 DIAGNOSIS — E538 Deficiency of other specified B group vitamins: Secondary | ICD-10-CM | POA: Diagnosis not present

## 2022-05-22 DIAGNOSIS — E782 Mixed hyperlipidemia: Secondary | ICD-10-CM

## 2022-05-22 DIAGNOSIS — G8929 Other chronic pain: Secondary | ICD-10-CM

## 2022-05-22 DIAGNOSIS — F331 Major depressive disorder, recurrent, moderate: Secondary | ICD-10-CM

## 2022-05-22 DIAGNOSIS — E559 Vitamin D deficiency, unspecified: Secondary | ICD-10-CM

## 2022-05-22 DIAGNOSIS — M25561 Pain in right knee: Secondary | ICD-10-CM

## 2022-05-22 MED ORDER — CYANOCOBALAMIN 1000 MCG/ML IJ SOLN
1000.0000 ug | Freq: Once | INTRAMUSCULAR | Status: AC
Start: 2022-05-22 — End: 2022-05-22
  Administered 2022-05-22: 1000 ug via INTRAMUSCULAR

## 2022-05-22 NOTE — Assessment & Plan Note (Signed)
Has 2 brownish moles over left sided upper back Likely benign skin lesions - appears like seborrheic keratoses, reassured If any change in size, shape or color, will refer to Dermatology

## 2022-05-22 NOTE — Assessment & Plan Note (Signed)
On Vitamin D 50,000 IU qw 

## 2022-05-22 NOTE — Assessment & Plan Note (Signed)
Asked about quitting: confirms that she currently smokes cigarettes. 8 cigarettes/day, but admits passive smoke exposure as well. Advise to quit smoking: Educated about QUITTING to reduce the risk of cancer, cardio and cerebrovascular disease. Assess willingness: Unwilling to quit at this time, but is working on cutting back. Assist with counseling and pharmacotherapy: Counseled for 5 minutes and literature provided. Arrange for follow up: Follow up in 3 months and continue to offer help. 

## 2022-05-22 NOTE — Assessment & Plan Note (Signed)
Likely due to OA of knee and/or old ACL injury (reported by patient) Check x-ray of knee Tylenol PRN for pain She does not want to take any other medicine for now Referred to Orthopedic surgery

## 2022-05-22 NOTE — Assessment & Plan Note (Signed)
Chronic, intermittent dyspnea, worse on exertion Needs to cut down to-> quit smoking Albuterol inhaler as needed for dyspnea or wheezing 

## 2022-05-22 NOTE — Assessment & Plan Note (Signed)
Refilled Vistaril PRN for anxiety On Seroquel for resistant depression
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
## 2022-05-22 NOTE — Assessment & Plan Note (Signed)
Lab Results  Component Value Date   HGBA1C 5.8 (H) 08/31/2021   Advised to follow low carb diet

## 2022-05-22 NOTE — Assessment & Plan Note (Signed)
Reports taking about 4 drinks/day and more on weekends, has cut down from 6 drinks/day Her insomnia, depression and chronic fatigue likely due to alcohol abuse Advised to cut down alcohol intake, patient expressed understanding. Offered medication and support group referral, she denies.

## 2022-05-22 NOTE — Patient Instructions (Signed)
Please continue to take medications as prescribed.  Please continue to follow low carb diet and perform moderate exercise/walking at least 150 mins/week.  Please consider getting Shingrix and Tdap vaccine at local pharmacy.  Please get fasting blood tests done before the next visit.
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
## 2022-05-22 NOTE — Assessment & Plan Note (Addendum)
Lipid profile reviewed Advised to follow low cholesterol diet for now On Pravastatin

## 2022-05-22 NOTE — Assessment & Plan Note (Signed)
Vitamin B12 injection today Advised to take B complex supplement 

## 2022-05-26 ENCOUNTER — Other Ambulatory Visit: Payer: Self-pay | Admitting: Internal Medicine

## 2022-05-26 DIAGNOSIS — E559 Vitamin D deficiency, unspecified: Secondary | ICD-10-CM

## 2022-05-31 ENCOUNTER — Encounter: Payer: Self-pay | Admitting: Internal Medicine

## 2022-05-31 ENCOUNTER — Other Ambulatory Visit: Payer: Self-pay | Admitting: Internal Medicine

## 2022-05-31 DIAGNOSIS — F411 Generalized anxiety disorder: Secondary | ICD-10-CM

## 2022-06-01 ENCOUNTER — Encounter: Payer: Self-pay | Admitting: Internal Medicine

## 2022-06-04 ENCOUNTER — Ambulatory Visit: Payer: Commercial Managed Care - PPO | Admitting: Internal Medicine

## 2022-06-10 ENCOUNTER — Encounter: Payer: Self-pay | Admitting: Internal Medicine

## 2022-06-21 ENCOUNTER — Ambulatory Visit: Payer: Commercial Managed Care - PPO | Admitting: Orthopedic Surgery

## 2022-06-21 ENCOUNTER — Other Ambulatory Visit: Payer: Self-pay | Admitting: Internal Medicine

## 2022-06-21 DIAGNOSIS — F331 Major depressive disorder, recurrent, moderate: Secondary | ICD-10-CM

## 2022-06-30 ENCOUNTER — Other Ambulatory Visit: Payer: Self-pay | Admitting: Internal Medicine

## 2022-07-03 ENCOUNTER — Encounter: Payer: Self-pay | Admitting: Internal Medicine

## 2022-07-09 ENCOUNTER — Telehealth (INDEPENDENT_AMBULATORY_CARE_PROVIDER_SITE_OTHER): Payer: Commercial Managed Care - PPO | Admitting: Internal Medicine

## 2022-07-09 ENCOUNTER — Encounter: Payer: Self-pay | Admitting: Internal Medicine

## 2022-07-09 DIAGNOSIS — J0140 Acute pansinusitis, unspecified: Secondary | ICD-10-CM | POA: Diagnosis not present

## 2022-07-09 MED ORDER — AMOXICILLIN-POT CLAVULANATE 875-125 MG PO TABS
1.0000 | ORAL_TABLET | Freq: Two times a day (BID) | ORAL | 0 refills | Status: DC
Start: 2022-07-09 — End: 2022-09-24

## 2022-07-09 MED ORDER — NOREL AD 4-10-325 MG PO TABS
1.0000 | ORAL_TABLET | Freq: Three times a day (TID) | ORAL | 0 refills | Status: DC | PRN
Start: 2022-07-09 — End: 2022-09-24

## 2022-07-09 NOTE — Assessment & Plan Note (Addendum)
Started empiric Augmentin as she has persistent symptoms despite symptomatic treatment Continue Flonase for allergies Norel AD as needed for nasal congestion Advised to use humidifier and/or vaporizer Advised to avoid passive smoke exposure

## 2022-07-09 NOTE — Patient Instructions (Signed)
Please start taking Augmentin as prescribed.  Please take Norel as needed for nasal congestion.  Okay to use humidifier and/or vaporizer for nasal congestion.

## 2022-07-09 NOTE — Progress Notes (Signed)
Virtual Visit via Video Note   Because of [name]'s co-morbid illnesses, she is at least at moderate risk for complications without adequate follow up.  This format is felt to be most appropriate for this patient at this time.  All issues noted in this document were discussed and addressed.  A limited physical exam was performed with this format.      Evaluation Performed:  Follow-up visit  Date:  07/09/2022   ID:  Jasmine Santos, DOB [birth year], MRN [id number]  Patient Location: Home Provider Location: Office/Clinic  Participants: Patient Location of Patient: Home Location of Provider: Telehealth Consent was obtain for visit to be over via telehealth. I verified that I am speaking with the correct person using two identifiers.  PCP:  Anabel Halon, MD   Chief Complaint: Nasal congestion, facial pain and postnasal drip  History of Present Illness:    Jasmine Santos is a 62 y.o. female who has a video visit for complaint of nasal congestion, sinus pressure related headache, bilateral ear pain and upper teeth pain for the last 2 weeks.  She also reports postnasal drip.  She has tried using Flonase and OTC decongestant without much relief.  Denies any fever or chills recently.  She has felt mild dyspnea upon exertion, but denies any wheezing.  The patient does not have symptoms concerning for COVID-19 infection (fever, chills, cough, or new shortness of breath).   Past Medical, Surgical, Social History, Allergies, and Medications have been Reviewed.  Past Medical History:  Diagnosis Date   ACL laxity 10/16/2011   Anxiety    Arthritis    [facility]reesia 12/28/2019   Depression    Follow-up examination    GERD (gastroesophageal reflux disease)    [facility]reesia 12/28/2019   Hyperlipemia    Knee pain    Old bucket handle tear medial meniscus 10/16/2011   Past Surgical History:  Procedure Laterality Date   ANKLE SURGERY     ESOPHAGEAL DILATION  02/01/2015   Procedure: ESOPHAGEAL  DILATION;  Surgeon: Malissa Hippo, MD;  [facility]: [facility];  Service: Endoscopy;;   ESOPHAGOGASTRODUODENOSCOPY N/A 02/01/2015   Procedure: ESOPHAGOGASTRODUODENOSCOPY (EGD);  Surgeon: Malissa Hippo, MD;  [facility]: [facility];  Service: Endoscopy;  Laterality: N/A;   FRACTURE SURGERY N/A    Phreesia 12/28/2019   KNEE SURGERY     TONSILLECTOMY       Current Meds  Medication Sig   amoxicillin-clavulanate (AUGMENTIN) 875-125 MG tablet Take 1 tablet by mouth 2 (two) times daily.   Chlorphen-PE-Acetaminophen (NOREL AD) 4-10-325 MG TABS Take 1 tablet by mouth 3 (three) times daily as needed.     Allergies:   Patient has no known allergies.   ROS:   Please see the history of present illness.     All other systems reviewed and are negative.   Labs/Other Tests and Data Reviewed:    Recent Labs: 08/31/2021: ALT 19; BUN 12; Creatinine, Ser 0.70; Hemoglobin 15.6; Platelets 259; Potassium 4.5; Sodium 142; TSH 3.730   Recent Lipid Panel Lab Results  Component Value Date/Time   CHOL 227 (H) 08/31/2021 10:10 AM   TRIG 300 (H) 08/31/2021 10:10 AM   HDL 55 08/31/2021 10:10 AM   CHOLHDL 4.1 08/31/2021 10:10 AM   LDLCALC 120 (H) 08/31/2021 10:10 AM    Wt Readings from Last 3 Encounters:  05/22/22 171 lb 3.2 oz (77.7 kg)  01/30/22 172 lb (78 kg)  09/11/21 170 lb 12.8 oz (77.5 kg)  Objective:    Vital Signs:  LMP 05/14/2010    VITAL SIGNS:  reviewed GEN:  no acute distress EYES:  sclerae anicteric, EOMI - Extraocular Movements Intact RESPIRATORY:  normal respiratory effort, symmetric expansion NEURO:  alert and oriented x 3, no obvious focal deficit PSYCH:  normal affect  ASSESSMENT & PLAN:    Acute non-recurrent pansinusitis Started empiric Augmentin as she has persistent symptoms despite symptomatic treatment Continue Flonase for allergies Norel AD as needed for nasal congestion Advised to use humidifier and/or vaporizer Advised to avoid passive smoke  exposure   I discussed the assessment and treatment plan with the patient. The patient was provided an opportunity to ask questions, and all were answered. The patient agreed with the plan and demonstrated an understanding of the instructions.   The patient was advised to call back or seek an in-person evaluation if the symptoms worsen or if the condition fails to improve as anticipated.  The above assessment and management plan was discussed with the patient. The patient verbalized understanding of and has agreed to the management plan.   Medication Adjustments/Labs and Tests Ordered: Current medicines are reviewed at length with the patient today.  Concerns regarding medicines are outlined above.   Tests Ordered: No orders of the defined types were placed in this encounter.   Medication Changes: Meds ordered this encounter  Medications   amoxicillin-clavulanate (AUGMENTIN) 875-125 MG tablet    Sig: Take 1 tablet by mouth 2 (two) times daily.    Dispense:  14 tablet    Refill:  0   Chlorphen-PE-Acetaminophen (NOREL AD) 4-10-325 MG TABS    Sig: Take 1 tablet by mouth 3 (three) times daily as needed.    Dispense:  30 tablet    Refill:  0     Note: This dictation was prepared with Dragon dictation along with smaller phrase technology. Similar sounding words can be transcribed inadequately or may not be corrected upon review. Any transcriptional errors that result from this process are unintentional.      Disposition:  Follow up  Signed, Anabel Halon, MD  07/09/2022 3:27 PM     [facility]

## 2022-08-16 ENCOUNTER — Other Ambulatory Visit: Payer: Self-pay | Admitting: Internal Medicine

## 2022-08-16 DIAGNOSIS — J410 Simple chronic bronchitis: Secondary | ICD-10-CM

## 2022-09-04 ENCOUNTER — Encounter: Payer: Self-pay | Admitting: Internal Medicine

## 2022-09-05 ENCOUNTER — Other Ambulatory Visit: Payer: Self-pay

## 2022-09-05 DIAGNOSIS — F411 Generalized anxiety disorder: Secondary | ICD-10-CM

## 2022-09-05 MED ORDER — HYDROXYZINE PAMOATE 25 MG PO CAPS
25.0000 mg | ORAL_CAPSULE | Freq: Two times a day (BID) | ORAL | 3 refills | Status: DC
Start: 2022-09-05 — End: 2022-12-07

## 2022-09-18 ENCOUNTER — Other Ambulatory Visit: Payer: Self-pay | Admitting: Internal Medicine

## 2022-09-18 DIAGNOSIS — F331 Major depressive disorder, recurrent, moderate: Secondary | ICD-10-CM

## 2022-09-24 ENCOUNTER — Ambulatory Visit (INDEPENDENT_AMBULATORY_CARE_PROVIDER_SITE_OTHER): Payer: Commercial Managed Care - PPO | Admitting: Internal Medicine

## 2022-09-24 ENCOUNTER — Encounter: Payer: Self-pay | Admitting: Internal Medicine

## 2022-09-24 VITALS — BP 117/74 | HR 89 | Ht 64.0 in | Wt 172.0 lb

## 2022-09-24 DIAGNOSIS — J410 Simple chronic bronchitis: Secondary | ICD-10-CM

## 2022-09-24 DIAGNOSIS — Z2821 Immunization not carried out because of patient refusal: Secondary | ICD-10-CM

## 2022-09-24 DIAGNOSIS — Z0001 Encounter for general adult medical examination with abnormal findings: Secondary | ICD-10-CM

## 2022-09-24 DIAGNOSIS — F1024 Alcohol dependence with alcohol-induced mood disorder: Secondary | ICD-10-CM

## 2022-09-24 DIAGNOSIS — Z720 Tobacco use: Secondary | ICD-10-CM | POA: Diagnosis not present

## 2022-09-24 DIAGNOSIS — F331 Major depressive disorder, recurrent, moderate: Secondary | ICD-10-CM | POA: Diagnosis not present

## 2022-09-24 DIAGNOSIS — J309 Allergic rhinitis, unspecified: Secondary | ICD-10-CM

## 2022-09-24 MED ORDER — BUPROPION HCL ER (XL) 150 MG PO TB24
150.0000 mg | ORAL_TABLET | Freq: Every day | ORAL | 3 refills | Status: DC
Start: 2022-09-24 — End: 2023-04-15
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
## 2022-09-24 NOTE — Assessment & Plan Note (Signed)
Reports h/o postnasal drip and sinus problems for many years Continue Allegra, can switch to Zyrtec or Claritin Flonase PRN Avoid dust and pollen exposure Quit smoking and avoid passive exposure if possible.

## 2022-09-24 NOTE — Patient Instructions (Addendum)
Please start taking Wellbutrin 150 mg once daily.  Please continue to take medications as prescribed.  Please continue to follow low carb diet and perform moderate exercise/walking at least 150 mins/week.

## 2022-09-24 NOTE — Assessment & Plan Note (Signed)
Chronic, intermittent dyspnea, worse on exertion Needs to cut down to-> quit smoking Albuterol inhaler as needed for dyspnea or wheezing
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
## 2022-09-24 NOTE — Assessment & Plan Note (Addendum)
Physical exam as documented. Fasting blood tests today. 

## 2022-09-24 NOTE — Assessment & Plan Note (Addendum)
Asked about quitting: confirms that she currently smokes cigarettes. 8 cigarettes/day, but admits passive smoke exposure as well. Advise to quit smoking: Educated about QUITTING to reduce the risk of cancer, cardio and cerebrovascular disease. Assess willingness: Unwilling to quit at this time, but is working on cutting back. Assist with counseling and pharmacotherapy: Counseled for 5 minutes and literature provided. Added Wellbutrin. Arrange for follow up: Follow up in 3 months and continue to offer help.

## 2022-09-24 NOTE — Assessment & Plan Note (Deleted)
Started empiric Augmentin as she has persistent symptoms despite symptomatic treatment Continue Flonase for allergies Norel AD as needed for nasal congestion Advised to use humidifier and/or vaporizer Advised to avoid passive smoke exposure

## 2022-09-24 NOTE — Assessment & Plan Note (Signed)
Reports taking about 3 drinks/day and more on weekends, has cut down from 6 drinks/day Her insomnia, depression and chronic fatigue likely due to alcohol abuse Advised to cut down alcohol intake, patient expressed understanding. Offered medication and support group referral ([facility]) in the past, she denies.

## 2022-10-07 ENCOUNTER — Other Ambulatory Visit: Payer: Self-pay | Admitting: Internal Medicine

## 2022-10-08 MED ORDER — PRAVASTATIN SODIUM 40 MG PO TABS: 40.0000 mg | ORAL_TABLET | Freq: Every day | ORAL | 0 refills | Status: DC

## 2022-11-16 ENCOUNTER — Institutional Professional Consult (permissible substitution): Payer: Self-pay | Admitting: Professional Counselor

## 2022-12-06 ENCOUNTER — Encounter: Payer: Self-pay | Admitting: Internal Medicine

## 2022-12-07 ENCOUNTER — Other Ambulatory Visit: Payer: Self-pay

## 2022-12-07 DIAGNOSIS — F411 Generalized anxiety disorder: Secondary | ICD-10-CM

## 2022-12-07 DIAGNOSIS — F331 Major depressive disorder, recurrent, moderate: Secondary | ICD-10-CM

## 2022-12-07 MED ORDER — QUETIAPINE FUMARATE 50 MG PO TABS
50.0000 mg | ORAL_TABLET | Freq: Every day | ORAL | 0 refills | Status: DC
Start: 2022-12-07 — End: 2023-03-14

## 2022-12-07 MED ORDER — HYDROXYZINE PAMOATE 25 MG PO CAPS: 25.0000 mg | ORAL_CAPSULE | Freq: Two times a day (BID) | ORAL | 0 refills | Status: DC

## 2022-12-14 ENCOUNTER — Encounter: Payer: Self-pay | Admitting: Internal Medicine

## 2022-12-16 ENCOUNTER — Encounter: Payer: Self-pay | Admitting: Internal Medicine

## 2022-12-17 ENCOUNTER — Other Ambulatory Visit: Payer: Self-pay

## 2022-12-17 DIAGNOSIS — N950 Postmenopausal bleeding: Secondary | ICD-10-CM

## 2022-12-17 NOTE — Telephone Encounter (Signed)
LVM for pt to call and reschedule appointment with [facility]

## 2023-01-07 ENCOUNTER — Encounter: Payer: Self-pay | Admitting: Internal Medicine

## 2023-01-08 ENCOUNTER — Other Ambulatory Visit: Payer: Self-pay

## 2023-01-08 DIAGNOSIS — Z1211 Encounter for screening for malignant neoplasm of colon: Secondary | ICD-10-CM

## 2023-01-10 ENCOUNTER — Other Ambulatory Visit: Payer: Self-pay | Admitting: Internal Medicine

## 2023-01-15 ENCOUNTER — Other Ambulatory Visit (HOSPITAL_COMMUNITY): Payer: Self-pay | Admitting: Internal Medicine

## 2023-01-15 DIAGNOSIS — Z1231 Encounter for screening mammogram for malignant neoplasm of breast: Secondary | ICD-10-CM

## 2023-01-23 ENCOUNTER — Ambulatory Visit (HOSPITAL_COMMUNITY): Payer: Commercial Managed Care - PPO

## 2023-01-24 ENCOUNTER — Ambulatory Visit: Payer: Commercial Managed Care - PPO | Admitting: Internal Medicine

## 2023-01-24 ENCOUNTER — Encounter: Payer: Commercial Managed Care - PPO | Admitting: Obstetrics & Gynecology

## 2023-01-26 LAB — COLOGUARD: COLOGUARD: NEGATIVE

## 2023-01-28 ENCOUNTER — Other Ambulatory Visit (HOSPITAL_COMMUNITY)
Admission: RE | Admit: 2023-01-28 | Discharge: 2023-01-28 | Disposition: A | Discharge status: Home / Self Care | Payer: Commercial Managed Care - PPO | Source: Ambulatory Visit | Attending: Obstetrics & Gynecology | Admitting: Obstetrics & Gynecology

## 2023-01-28 ENCOUNTER — Encounter: Payer: Commercial Managed Care - PPO | Admitting: Obstetrics & Gynecology

## 2023-01-28 ENCOUNTER — Ambulatory Visit (INDEPENDENT_AMBULATORY_CARE_PROVIDER_SITE_OTHER): Payer: Commercial Managed Care - PPO | Admitting: Obstetrics & Gynecology

## 2023-01-28 ENCOUNTER — Encounter: Payer: Self-pay | Admitting: Obstetrics & Gynecology

## 2023-01-28 VITALS — BP 117/67 | HR 88 | Ht 64.0 in | Wt 173.0 lb

## 2023-01-28 DIAGNOSIS — Z124 Encounter for screening for malignant neoplasm of cervix: Secondary | ICD-10-CM | POA: Diagnosis not present

## 2023-01-28 DIAGNOSIS — N950 Postmenopausal bleeding: Secondary | ICD-10-CM
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
## 2023-01-29 ENCOUNTER — Encounter: Payer: Self-pay | Admitting: Obstetrics & Gynecology

## 2023-01-30 ENCOUNTER — Ambulatory Visit (HOSPITAL_COMMUNITY)
Admission: RE | Admit: 2023-01-30 | Discharge: 2023-01-30 | Disposition: A | Discharge status: Home / Self Care | Payer: Commercial Managed Care - PPO | Source: Ambulatory Visit | Attending: Internal Medicine | Admitting: Internal Medicine

## 2023-01-30 DIAGNOSIS — Z1231 Encounter for screening mammogram for malignant neoplasm of breast: Secondary | ICD-10-CM | POA: Insufficient documentation

## 2023-02-01 LAB — CYTOLOGY - PAP
Adequacy: ABSENT
Comment: NEGATIVE
Diagnosis: NEGATIVE
Diagnosis: REACTIVE
High risk HPV: NEGATIVE

## 2023-02-05 ENCOUNTER — Encounter: Payer: Self-pay | Admitting: Obstetrics & Gynecology

## 2023-02-08 ENCOUNTER — Other Ambulatory Visit: Payer: Self-pay | Admitting: Obstetrics & Gynecology

## 2023-02-08 DIAGNOSIS — N950 Postmenopausal bleeding: Secondary | ICD-10-CM

## 2023-02-11 ENCOUNTER — Ambulatory Visit (INDEPENDENT_AMBULATORY_CARE_PROVIDER_SITE_OTHER): Payer: Commercial Managed Care - PPO

## 2023-02-11 ENCOUNTER — Ambulatory Visit (INDEPENDENT_AMBULATORY_CARE_PROVIDER_SITE_OTHER): Payer: Commercial Managed Care - PPO | Admitting: Obstetrics & Gynecology

## 2023-02-11 ENCOUNTER — Encounter: Payer: Self-pay | Admitting: Obstetrics & Gynecology

## 2023-02-11 ENCOUNTER — Other Ambulatory Visit (HOSPITAL_COMMUNITY)
Admission: RE | Admit: 2023-02-11 | Discharge: 2023-02-11 | Disposition: A | Discharge status: Home / Self Care | Payer: Commercial Managed Care - PPO | Source: Ambulatory Visit | Attending: Obstetrics & Gynecology | Admitting: Obstetrics & Gynecology

## 2023-02-11 VITALS — BP 148/94 | HR 77 | Ht 64.0 in | Wt 173.0 lb

## 2023-02-11 DIAGNOSIS — R9389 Abnormal findings on diagnostic imaging of other specified body structures: Secondary | ICD-10-CM

## 2023-02-11 DIAGNOSIS — N950 Postmenopausal bleeding: Secondary | ICD-10-CM

## 2023-02-11 NOTE — Progress Notes (Signed)
PELVIC US TA/TV: homogeneous atropic axial positioned uterus,WNL,complex thickened endometrium with minimal color flow,EEC 19.8 mm,normal ovaries,limited view of ovaries,no free fluid,no pain during ultrasound   Chaperone Kelia

## 2023-02-11 NOTE — Progress Notes (Signed)
Endometrial Biopsy Procedure Note  Pre-operative Diagnosis: Post menopausal bleeding since beginning 12/2022                                            Endometrium 19 mm complex appearance today  Post-operative Diagnosis: same  Indications: postmenopausal bleeding  Procedure Details   Urine pregnancy test was not done.  The risks (including infection, bleeding, pain, and uterine perforation) and benefits of the procedure were explained to the patient and Written informed consent was obtained.  Antibiotic prophylaxis against endocarditis was not indicated.   The patient was placed in the dorsal lithotomy position.  Bimanual exam showed the uterus to be in the neutral position.  A Graves' speculum inserted in the vagina, and the cervix prepped with povidone iodine.  Endocervical curettage with a Kevorkian curette was not performed.   A sharp tenaculum was applied to the anterior lip of the cervix for stabilization.  A sterile uterine sound was used to sound the uterus to a depth of 6.5 cm.  A Pipelle endometrial aspirator was used to sample the endometrium.  Sample was sent for pathologic examination.  Condition: Stable  Complications: None  Plan:  The patient was advised to call for any fever or for prolonged or severe pain or bleeding. She was advised to use OTC analgesics as needed for mild to moderate pain. She was advised to avoid vaginal intercourse for 48 hours or until the bleeding has completely stopped.  Attending Physician Documentation: I was present for or performed the following: endometrial biopsy    If no diagnosis of endometrial cancer will need a formal D&C for thorough evaluation of entire endometrium

## 2023-02-13 LAB — SURGICAL PATHOLOGY

## 2023-02-14 ENCOUNTER — Encounter: Payer: Self-pay | Admitting: Obstetrics & Gynecology

## 2023-02-18 ENCOUNTER — Encounter: Payer: Self-pay | Admitting: Obstetrics & Gynecology

## 2023-02-28 ENCOUNTER — Other Ambulatory Visit: Payer: Self-pay | Admitting: Internal Medicine

## 2023-02-28 DIAGNOSIS — F411 Generalized anxiety disorder: Secondary | ICD-10-CM

## 2023-03-08 ENCOUNTER — Other Ambulatory Visit: Payer: Self-pay | Admitting: Obstetrics & Gynecology

## 2023-03-08 DIAGNOSIS — Z01818 Encounter for other preprocedural examination: Secondary | ICD-10-CM

## 2023-03-08 NOTE — Patient Instructions (Signed)
 Jasmine Santos  03/08/2023     @PREFPERIOPPHARMACY @   Your procedure is scheduled on 03/13/2023.   Report to [facility] at  1145  A.M.   Call this number if you have problems the morning of surgery:  [phone]  If you experience any cold or flu symptoms such as cough, fever, chills, shortness of breath, etc. between now and your scheduled surgery, please notify us at the above number.   Remember:         Use your inhaler before you come and bring your rescue inhaler with you.   Do not eat after midnight.   You may drink clear liquids until 0945 am on 03/13/2023.   Clear liquids allowed are:                    Water, Juice (No red color; non-citric and without pulp; diabetics please choose diet or no sugar options), Carbonated beverages (diabetics please choose diet or no sugar options), Clear Tea (No creamer, milk, or cream, including half & half and powdered creamer), Black Coffee Only (No creamer, milk or cream, including half & half and powdered creamer), and Clear Sports drink (No red color; diabetics please choose diet or no sugar options)        At 0945 am on 03/13/2023 drink your carb drink. You can have nothing else to drink after this.    Take these medicines the morning of surgery with A SIP OF WATER                                 bupropion, hydroxyzine.     Do not wear jewelry, make-up or nail polish, including gel polish,  artificial nails, or any other type of covering on natural nails (fingers and  toes).  Do not wear lotions, powders, or perfumes, or deodorant.  Do not shave 48 hours prior to surgery.  Men may shave face and neck.  Do not bring valuables to the hospital.  [facility] is not responsible for any belongings or valuables.  Contacts, dentures or bridgework may not be worn into surgery.  Leave your suitcase in the car.  After surgery it may be brought to your room.  For patients admitted to the hospital, discharge time will be  determined by your treatment team.  Patients discharged the day of surgery will not be allowed to drive home and must have someone with them for 24 hours.    Special instructions:   DO NOT smoke tobacco or vape for 24 hours before your procedure.  Please read over the following fact sheets that you were given. Coughing and Deep Breathing, Anesthesia Post-op Instructions, and Care and Recovery After Surgery      Dilation and Curettage or Vacuum Curettage, Care After The following information offers guidance on how to care for yourself after your procedure. Your doctor may also give you more specific instructions. If you have problems or questions, contact your doctor. What can I expect after the procedure? After the procedure, it is common to have: Mild pain or cramps. Some bleeding or spotting from the vagina. These may last for up to 2 weeks. Follow these instructions at home: Medicines Take over-the-counter and prescription medicines only as told by your doctor. If told, take steps to prevent problems with pooping (constipation). You may need to: Drink enough fluid to keep your pee (  urine) pale yellow. Take medicines. You will be told what medicines to take. Eat foods that are high in fiber. These include beans, whole grains, and fresh fruits and vegetables. Limit foods that are high in fat and sugar. These include fried or sweet foods. Ask your doctor if you should avoid driving or using machines while you are taking your medicine. Activity  If you were given a medicine to help you relax (sedative) during your procedure, it can affect you for many hours. Do not drive or use machinery until your doctor says that it is safe. Rest as told by your doctor. Get up to take short walks every 1-2 hours. Ask for help if you feel weak or unsteady. Do not lift anything that is heavier than 10 lb (4.5 kg), or the limit that you are told. Return to your normal activities when your doctor says  that it is safe. Lifestyle For at least 2 weeks, or as long as told by your doctor: Do not douche. Do not use tampons. Do not have sex. General instructions Do not take baths, swim, or use a hot tub. Ask your doctor if you may take showers. Do not smoke or use any products that contain nicotine or tobacco. These can delay healing. If you need help quitting, ask your doctor. Wear compression stockings as told by your doctor. It is up to you to get the results of your procedure. Ask how to get your results when they are ready. Keep all follow-up visits. Contact a doctor if: You have very bad cramps that get worse or do not get better with medicine. You have very bad pain in your belly (abdomen). You cannot drink fluids without vomiting. You have pain in the area just above your thighs. You have fluid from your vagina that smells bad. You have a rash. Get help right away if: You are bleeding a lot from your vagina. This means soaking more than one sanitary pad in 1 hour, and this happens for 2 hours in a row. You have a fever that is above 100.76F (38C). Your belly feels very tender or hard. You have chest pain. You have trouble breathing. You feel dizzy or light-headed. You faint. You have pain in your neck or shoulder area. These symptoms may be an emergency. Get help right away. Call your local emergency services (911 in the [place] Do not wait to see if the symptoms will go away. Do not drive yourself to the hospital. Summary After your procedure, it is common to have pain or cramping. It is also common to have bleeding or spotting from your vagina. Rest as told. Get up to take short walks every 1-2 hours. Do not lift anything that is heavier than 10 lb (4.5 kg), or the limit that you are told. Get help right away if you have problems from the procedure. Ask your doctor what problems to watch for. This information is not intended to replace advice given to you by your health care  provider. Make sure you discuss any questions you have with your health care provider. Document Revised: 12/21/2019 Document Reviewed: 12/23/2019 Elsevier Patient Education  2024 Elsevier Inc.General Anesthesia, Adult, Care After The following information offers guidance on how to care for yourself after your procedure. Your health care provider may also give you more specific instructions. If you have problems or questions, contact your health care provider. What can I expect after the procedure? After the procedure, it is common for people to: Have  pain or discomfort at the IV site. Have nausea or vomiting. Have a sore throat or hoarseness. Have trouble concentrating. Feel cold or chills. Feel weak, sleepy, or tired (fatigue). Have soreness and body aches. These can affect parts of the body that were not involved in surgery. Follow these instructions at home: For the time period you were told by your health care provider:  Rest. Do not participate in activities where you could fall or become injured. Do not drive or use machinery. Do not drink alcohol. Do not take sleeping pills or medicines that cause drowsiness. Do not make important decisions or sign legal documents. Do not take care of children on your own. General instructions Drink enough fluid to keep your urine pale yellow. If you have sleep apnea, surgery and certain medicines can increase your risk for breathing problems. Follow instructions from your health care provider about wearing your sleep device: Anytime you are sleeping, including during daytime naps. While taking prescription pain medicines, sleeping medicines, or medicines that make you drowsy. Return to your normal activities as told by your health care provider. Ask your health care provider what activities are safe for you. Take over-the-counter and prescription medicines only as told by your health care provider. Do not use any products that contain nicotine  or tobacco. These products include cigarettes, chewing tobacco, and vaping devices, such as e-cigarettes. These can delay incision healing after surgery. If you need help quitting, ask your health care provider. Contact a health care provider if: You have nausea or vomiting that does not get better with medicine. You vomit every time you eat or drink. You have pain that does not get better with medicine. You cannot urinate or have bloody urine. You develop a skin rash. You have a fever. Get help right away if: You have trouble breathing. You have chest pain. You vomit blood. These symptoms may be an emergency. Get help right away. Call 911. Do not wait to see if the symptoms will go away. Do not drive yourself to the hospital. Summary After the procedure, it is common to have a sore throat, hoarseness, nausea, vomiting, or to feel weak, sleepy, or fatigue. For the time period you were told by your health care provider, do not drive or use machinery. Get help right away if you have difficulty breathing, have chest pain, or vomit blood. These symptoms may be an emergency. This information is not intended to replace advice given to you by your health care provider. Make sure you discuss any questions you have with your health care provider. Document Revised: 03/31/2021 Document Reviewed: 03/31/2021 Elsevier Patient Education  2024 Elsevier Inc.How to Use Chlorhexidine at Home in the Shower Chlorhexidine gluconate (CHG) is a germ-killing (antiseptic) wash that's used to clean the skin. It can get rid of the germs that normally live on the skin and can keep them away for about 24 hours. If you're having surgery, you may be told to shower with CHG at home the night before surgery. This can help lower your risk for infection. To use CHG wash in the shower, follow the steps below. Supplies needed: CHG body wash. Clean washcloth. Clean towel. How to use CHG in the shower Follow these steps  unless you're told to use CHG in a different way: Start the shower. Use your normal soap and shampoo to wash your face and hair. Turn off the shower or move out of the shower stream. Pour CHG onto a clean washcloth. Do not use  any type of brush or rough sponge. Start at your neck, washing your body down to your toes. Make sure you: Wash the part of your body where the surgery will be done for at least 1 minute. Do not scrub. Do not use CHG on your head or face unless your health care provider tells you to. If it gets into your ears or eyes, rinse them well with water. Do not wash your genitals with CHG. Wash your back and under your arms. Make sure to wash skin folds. Let the CHG sit on your skin for 1-2 minutes or as long as told. Rinse your entire body in the shower, including all body creases and folds. Turn off the shower. Dry off with a clean towel. Do not put anything on your skin afterward, such as powder, lotion, or perfume. Put on clean clothes or pajamas. If it's the night before surgery, sleep in clean sheets. General tips Use CHG only as told, and follow the instructions on the label. Use the full amount of CHG as told. This is often one bottle. Do not smoke and stay away from flames after using CHG. Your skin may feel sticky after using CHG. This is normal. The sticky feeling will go away as the CHG dries. Do not use CHG: If you have a chlorhexidine allergy or have reacted to chlorhexidine in the past. On open wounds or areas of skin that have broken skin, cuts, or scrapes. On babies younger than 69 months of age. Contact a health care provider if: You have questions about using CHG. Your skin gets irritated or itchy. You have a rash after using CHG. You swallow any CHG. Call your local poison control center [phone] in the [place] Your eyes itch badly, or they become very red or swollen. Your hearing changes. You have trouble seeing. If you can't reach your  provider, go to an urgent care or emergency room. Do not drive yourself. Get help right away if: You have swelling or tingling in your mouth or throat. You make high-pitched whistling sounds when you breathe, most often when you breathe out (wheeze). You have trouble breathing. These symptoms may be an emergency. Call 911 right away. Do not wait to see if the symptoms will go away. Do not drive yourself to the hospital. This information is not intended to replace advice given to you by your health care provider. Make sure you discuss any questions you have with your health care provider. Document Revised: 07/17/2022 Document Reviewed: 07/13/2021 Elsevier Patient Education  2024 [employer].

## 2023-03-11 ENCOUNTER — Encounter (HOSPITAL_COMMUNITY)
Admission: RE | Admit: 2023-03-11 | Discharge: 2023-03-11 | Disposition: A | Discharge status: Home / Self Care | Payer: Commercial Managed Care - PPO | Source: Ambulatory Visit | Attending: Obstetrics & Gynecology | Admitting: Obstetrics & Gynecology

## 2023-03-11 ENCOUNTER — Encounter (HOSPITAL_COMMUNITY): Payer: Self-pay

## 2023-03-11 VITALS — BP 124/77 | HR 91 | Temp 97.8°F | Resp 18 | Ht 64.0 in | Wt 173.1 lb

## 2023-03-11 DIAGNOSIS — R7303 Prediabetes: Secondary | ICD-10-CM

## 2023-03-11 DIAGNOSIS — Z01818 Encounter for other preprocedural examination: Secondary | ICD-10-CM | POA: Diagnosis present

## 2023-03-11 DIAGNOSIS — R9431 Abnormal electrocardiogram [ECG] [EKG]: Secondary | ICD-10-CM | POA: Diagnosis not present

## 2023-03-11 DIAGNOSIS — Z720 Tobacco use: Secondary | ICD-10-CM

## 2023-03-11 HISTORY — DX: Prediabetes: R73.03

## 2023-03-11 LAB — COMPREHENSIVE METABOLIC PANEL
ALT: 20 U/L (ref 0–44)
AST: 20 U/L (ref 15–41)
Albumin: 4.2 g/dL (ref 3.5–5.0)
Alkaline Phosphatase: 66 U/L (ref 38–126)
Anion gap: 12 (ref 5–15)
BUN: 15 mg/dL (ref 8–23)
CO2: 22 mmol/L (ref 22–32)
Calcium: 9.1 mg/dL (ref 8.9–10.3)
Chloride: 102 mmol/L (ref 98–111)
Creatinine, Ser: 0.72 mg/dL (ref 0.44–1.00)
GFR, Estimated: >60 mL/min (ref >60)
Glucose, Bld: 101 mg/dL — ABNORMAL HIGH (ref 70–99)
Potassium: 3.8 mmol/L (ref 3.5–5.1)
Sodium: 136 mmol/L (ref 135–145)
Total Bilirubin: 0.6 mg/dL (ref 0.0–1.2)
Total Protein: 7.1 g/dL (ref 6.5–8.1)

## 2023-03-11 LAB — RAPID URINE DRUG SCREEN, HOSP PERFORMED
Amphetamines: NOT DETECTED
Barbiturates: NOT DETECTED
Benzodiazepines: NOT DETECTED
Cocaine: POSITIVE — AB
Opiates: NOT DETECTED
Tetrahydrocannabinol: NOT DETECTED

## 2023-03-11 LAB — CBC
HCT: 46.7 % — ABNORMAL HIGH (ref 36.0–46.0)
Hemoglobin: 15.8 g/dL — ABNORMAL HIGH (ref 12.0–15.0)
MCH: 30.7 pg (ref 26.0–34.0)
MCHC: 33.8 g/dL (ref 30.0–36.0)
MCV: 90.7 fL (ref 80.0–100.0)
Platelets: 246 10*3/uL (ref 150–400)
RBC: 5.15 MIL/uL — ABNORMAL HIGH (ref 3.87–5.11)
RDW: 12.5 % (ref 11.5–15.5)
WBC: 9 10*3/uL (ref 4.0–10.5)
nRBC: 0 % (ref 0.0–0.2)

## 2023-03-11 LAB — RAPID HIV SCREEN (HIV 1/2 AB+AG)
HIV 1/2 Antibodies: NONREACTIVE
HIV-1 P24 Antigen - HIV24: NONREACTIVE

## 2023-03-12 ENCOUNTER — Encounter (HOSPITAL_COMMUNITY): Payer: Self-pay | Admitting: Anesthesiology

## 2023-03-12 LAB — HEMOGLOBIN A1C
Hgb A1c MFr Bld: 5.7 % — ABNORMAL HIGH (ref 4.8–5.6)
Mean Plasma Glucose: 117 mg/dL

## 2023-03-13 ENCOUNTER — Other Ambulatory Visit: Payer: Self-pay | Admitting: Internal Medicine

## 2023-03-13 ENCOUNTER — Ambulatory Visit (HOSPITAL_COMMUNITY): Admit: 2023-03-13 | Payer: Commercial Managed Care - PPO | Admitting: Obstetrics & Gynecology

## 2023-03-13 ENCOUNTER — Encounter: Payer: Self-pay | Admitting: Obstetrics & Gynecology

## 2023-03-13 DIAGNOSIS — F331 Major depressive disorder, recurrent, moderate: Secondary | ICD-10-CM

## 2023-03-13 SURGERY — DILATATION AND CURETTAGE /HYSTEROSCOPY
Anesthesia: General

## 2023-03-18 ENCOUNTER — Ambulatory Visit: Payer: Self-pay | Admitting: Internal Medicine
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
## 2023-03-19 ENCOUNTER — Telehealth: Admitting: Internal Medicine

## 2023-03-19 ENCOUNTER — Encounter: Payer: Self-pay | Admitting: Internal Medicine

## 2023-03-19 ENCOUNTER — Ambulatory Visit (HOSPITAL_COMMUNITY)
Admission: RE | Admit: 2023-03-19 | Discharge: 2023-03-19 | Disposition: A | Discharge status: Home / Self Care | Source: Ambulatory Visit | Attending: Internal Medicine | Admitting: Internal Medicine

## 2023-03-19 DIAGNOSIS — R0789 Other chest pain: Secondary | ICD-10-CM | POA: Diagnosis present

## 2023-03-19 MED ORDER — IBUPROFEN 600 MG PO TABS: 600.0000 mg | ORAL_TABLET | Freq: Three times a day (TID) | ORAL | 0 refills | Status: DC | PRN

## 2023-03-19 MED ORDER — TRAMADOL HCL 50 MG PO TABS: 50.0000 mg | ORAL_TABLET | Freq: Two times a day (BID) | ORAL | 0 refills | Status: DC | PRN

## 2023-03-19 NOTE — Telephone Encounter (Signed)
 Appt scheduled
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
## 2023-03-19 NOTE — Patient Instructions (Signed)
 Please get x-ray of ribs done at [facility].  Please take Ibuprofen for mild-moderate pain (1-7/10) and Tramadol for severe pain (8-10/10) only.

## 2023-03-25 ENCOUNTER — Encounter: Payer: Commercial Managed Care - PPO | Admitting: Obstetrics & Gynecology

## 2023-03-26 ENCOUNTER — Encounter: Payer: Self-pay | Admitting: Internal Medicine

## 2023-04-08 ENCOUNTER — Encounter: Payer: Self-pay | Admitting: Internal Medicine

## 2023-04-11 ENCOUNTER — Encounter: Payer: Self-pay | Admitting: Internal Medicine

## 2023-04-13 ENCOUNTER — Other Ambulatory Visit: Payer: Self-pay | Admitting: Internal Medicine

## 2023-04-13 DIAGNOSIS — F331 Major depressive disorder, recurrent, moderate: Secondary | ICD-10-CM

## 2023-04-29 ENCOUNTER — Encounter: Payer: Self-pay | Admitting: Obstetrics & Gynecology

## 2023-05-01 ENCOUNTER — Other Ambulatory Visit: Payer: Self-pay | Admitting: Internal Medicine

## 2023-05-01 MED ORDER — PRAVASTATIN SODIUM 40 MG PO TABS: 40.0000 mg | ORAL_TABLET | Freq: Every day | ORAL | 0 refills | Status: DC

## 2023-05-10 NOTE — Patient Instructions (Addendum)
 Jasmine Santos  05/10/2023     @PREFPERIOPPHARMACY @   Your procedure is scheduled on  05/15/2023.   Report to [facility] at  [phone] A.M.   Call this number if you have problems the morning of surgery:  [phone]  If you experience any cold or flu symptoms such as cough, fever, chills, shortness of breath, etc. between now and your scheduled surgery, please notify us  at the above number.   Remember:  Do not eat after midnight.   You may drink clear liquids until  0555 am on 05/15/2023.    Clear liquids allowed are:                    Water , Juice (No red color; non-citric and without pulp; diabetics please choose diet or no sugar options), Carbonated beverages (diabetics please choose diet or no sugar options), Clear Tea (No creamer, milk, or cream, including half & half and powdered creamer), Black Coffee Only (No creamer, milk or cream, including half & half and powdered creamer), and Clear Sports drink (No red color; diabetics please choose diet or no sugar options)         At 0555 am on 05/15/2023 drink 1-12 G2. You can have nothing else to drink after this.   Take these medicines the morning of surgery with A SIP OF WATER                                                Hydroxyzine .    Do not wear jewelry, make-up or nail polish, including gel polish,  artificial nails, or any other type of covering on natural nails (fingers and  toes).  Do not wear lotions, powders, or perfumes, or deodorant.  Do not shave 48 hours prior to surgery.  Men may shave face and neck.  Do not bring valuables to the hospital.  [facility] is not responsible for any belongings or valuables.  Contacts, dentures or bridgework may not be worn into surgery.  Leave your suitcase in the car.  After surgery it may be brought to your room.  For patients admitted to the hospital, discharge time will be determined by your treatment team.  Patients discharged the day of surgery will not be allowed  to drive home and must have someone with them for 24 hours.    Special instructions:  DO NOT smoke tobacco or vape for 24 hours before your procedure.  Please read over the following fact sheets that you were given. Coughing and Deep Breathing, Surgical Site Infection Prevention, Anesthesia Post-op Instructions, and Care and Recovery After Surgery      Dilation and Curettage or Vacuum Curettage, Care After The following information offers guidance on how to care for yourself after your procedure. Your doctor may also give you more specific instructions. If you have problems or questions, contact your doctor. What can I expect after the procedure? After the procedure, it is common to have: Mild pain or cramps. Some bleeding or spotting from the vagina. These may last for up to 2 weeks. Follow these instructions at home: Medicines Take over-the-counter and prescription medicines only as told by your doctor. If told, take steps to prevent problems with pooping (constipation). You may need to: Drink enough fluid to keep your pee (urine) pale yellow. Take medicines. You will  be told what medicines to take. Eat foods that are high in fiber. These include beans, whole grains, and fresh fruits and vegetables. Limit foods that are high in fat and sugar. These include fried or sweet foods. Ask your doctor if you should avoid driving or using machines while you are taking your medicine. Activity  If you were given a medicine to help you relax (sedative) during your procedure, it can affect you for many hours. Do not drive or use machinery until your doctor says that it is safe. Rest as told by your doctor. Get up to take short walks every 1-2 hours. Ask for help if you feel weak or unsteady. Do not lift anything that is heavier than 10 lb (4.5 kg), or the limit that you are told. Return to your normal activities when your doctor says that it is safe. Lifestyle For at least 2 weeks, or as long  as told by your doctor: Do not douche. Do not use tampons. Do not have sex. General instructions Do not take baths, swim, or use a hot tub. Ask your doctor if you may take showers. Do not smoke or use any products that contain nicotine or tobacco. These can delay healing. If you need help quitting, ask your doctor. Wear compression stockings as told by your doctor. It is up to you to get the results of your procedure. Ask how to get your results when they are ready. Keep all follow-up visits. Contact a doctor if: You have very bad cramps that get worse or do not get better with medicine. You have very bad pain in your belly (abdomen). You cannot drink fluids without vomiting. You have pain in the area just above your thighs. You have fluid from your vagina that smells bad. You have a rash. Get help right away if: You are bleeding a lot from your vagina. This means soaking more than one sanitary pad in 1 hour, and this happens for 2 hours in a row. You have a fever that is above 100.6F (38C). Your belly feels very tender or hard. You have chest pain. You have trouble breathing. You feel dizzy or light-headed. You faint. You have pain in your neck or shoulder area. These symptoms may be an emergency. Get help right away. Call your local emergency services (911 in the [place] Do not wait to see if the symptoms will go away. Do not drive yourself to the hospital. Summary After your procedure, it is common to have pain or cramping. It is also common to have bleeding or spotting from your vagina. Rest as told. Get up to take short walks every 1-2 hours. Do not lift anything that is heavier than 10 lb (4.5 kg), or the limit that you are told. Get help right away if you have problems from the procedure. Ask your doctor what problems to watch for. This information is not intended to replace advice given to you by your health care provider. Make sure you discuss any questions you have with  your health care provider. Document Revised: 12/21/2019 Document Reviewed: 12/23/2019 Elsevier Patient Education  2024 [employer]General Anesthesia, Adult, Care After The following information offers guidance on how to care for yourself after your procedure. Your health care provider may also give you more specific instructions. If you have problems or questions, contact your health care provider. What can I expect after the procedure? After the procedure, it is common for people to: Have pain or discomfort at the IV site.  Have nausea or vomiting. Have a sore throat or hoarseness. Have trouble concentrating. Feel cold or chills. Feel weak, sleepy, or tired (fatigue). Have soreness and body aches. These can affect parts of the body that were not involved in surgery. Follow these instructions at home: For the time period you were told by your health care provider:  Rest. Do not participate in activities where you could fall or become injured. Do not drive or use machinery. Do not drink alcohol. Do not take sleeping pills or medicines that cause drowsiness. Do not make important decisions or sign legal documents. Do not take care of children on your own. General instructions Drink enough fluid to keep your urine pale yellow. If you have sleep apnea, surgery and certain medicines can increase your risk for breathing problems. Follow instructions from your health care provider about wearing your sleep device: Anytime you are sleeping, including during daytime naps. While taking prescription pain medicines, sleeping medicines, or medicines that make you drowsy. Return to your normal activities as told by your health care provider. Ask your health care provider what activities are safe for you. Take over-the-counter and prescription medicines only as told by your health care provider. Do not use any products that contain nicotine or tobacco. These products include cigarettes, chewing  tobacco, and vaping devices, such as e-cigarettes. These can delay incision healing after surgery. If you need help quitting, ask your health care provider. Contact a health care provider if: You have nausea or vomiting that does not get better with medicine. You vomit every time you eat or drink. You have pain that does not get better with medicine. You cannot urinate or have bloody urine. You develop a skin rash. You have a fever. Get help right away if: You have trouble breathing. You have chest pain. You vomit blood. These symptoms may be an emergency. Get help right away. Call 911. Do not wait to see if the symptoms will go away. Do not drive yourself to the hospital. Summary After the procedure, it is common to have a sore throat, hoarseness, nausea, vomiting, or to feel weak, sleepy, or fatigue. For the time period you were told by your health care provider, do not drive or use machinery. Get help right away if you have difficulty breathing, have chest pain, or vomit blood. These symptoms may be an emergency. This information is not intended to replace advice given to you by your health care provider. Make sure you discuss any questions you have with your health care provider. Document Revised: 03/31/2021 Document Reviewed: 03/31/2021 Elsevier Patient Education  2024 [employer].

## 2023-05-13 ENCOUNTER — Encounter (HOSPITAL_COMMUNITY): Payer: Self-pay

## 2023-05-13 ENCOUNTER — Encounter (HOSPITAL_COMMUNITY)
Admission: RE | Admit: 2023-05-13 | Discharge: 2023-05-13 | Disposition: A | Discharge status: Home / Self Care | Payer: Self-pay | Source: Ambulatory Visit | Attending: Obstetrics & Gynecology | Admitting: Obstetrics & Gynecology

## 2023-05-13 ENCOUNTER — Other Ambulatory Visit: Payer: Self-pay | Admitting: Obstetrics & Gynecology

## 2023-05-13 ENCOUNTER — Other Ambulatory Visit: Payer: Self-pay

## 2023-05-13 DIAGNOSIS — R7303 Prediabetes: Secondary | ICD-10-CM | POA: Diagnosis not present

## 2023-05-13 DIAGNOSIS — Z01812 Encounter for preprocedural laboratory examination: Secondary | ICD-10-CM | POA: Insufficient documentation

## 2023-05-13 DIAGNOSIS — Z01818 Encounter for other preprocedural examination: Secondary | ICD-10-CM

## 2023-05-13 LAB — COMPREHENSIVE METABOLIC PANEL WITH GFR
ALT: 21 U/L (ref 0–44)
AST: 21 U/L (ref 15–41)
Albumin: 4 g/dL (ref 3.5–5.0)
Alkaline Phosphatase: 79 U/L (ref 38–126)
Anion gap: 12 (ref 5–15)
BUN: 14 mg/dL (ref 8–23)
CO2: 22 mmol/L (ref 22–32)
Calcium: 9.3 mg/dL (ref 8.9–10.3)
Chloride: 102 mmol/L (ref 98–111)
Creatinine, Ser: 0.6 mg/dL (ref 0.44–1.00)
GFR, Estimated: >60 mL/min (ref >60)
Glucose, Bld: 106 mg/dL — ABNORMAL HIGH (ref 70–99)
Potassium: 3.8 mmol/L (ref 3.5–5.1)
Sodium: 136 mmol/L (ref 135–145)
Total Bilirubin: 0.6 mg/dL (ref 0.0–1.2)
Total Protein: 7 g/dL (ref 6.5–8.1)

## 2023-05-13 LAB — CBC
HCT: 44.8 % (ref 36.0–46.0)
Hemoglobin: 15.4 g/dL — ABNORMAL HIGH (ref 12.0–15.0)
MCH: 30.9 pg (ref 26.0–34.0)
MCHC: 34.4 g/dL (ref 30.0–36.0)
MCV: 89.8 fL (ref 80.0–100.0)
Platelets: 253 10*3/uL (ref 150–400)
RBC: 4.99 MIL/uL (ref 3.87–5.11)
RDW: 13.2 % (ref 11.5–15.5)
WBC: 9.5 10*3/uL (ref 4.0–10.5)
nRBC: 0 % (ref 0.0–0.2)

## 2023-05-13 LAB — HEMOGLOBIN A1C
Hgb A1c MFr Bld: 5.6 % (ref 4.8–5.6)
Mean Plasma Glucose: 114.02 mg/dL

## 2023-05-13 NOTE — Pre-Procedure Instructions (Signed)
 CHG soap instructions given to patient-handout given. Patient has soap at home.  Per pt

## 2023-05-15 ENCOUNTER — Ambulatory Visit (HOSPITAL_COMMUNITY): Admitting: Anesthesiology

## 2023-05-15 ENCOUNTER — Ambulatory Visit (HOSPITAL_COMMUNITY)
Admission: RE | Admit: 2023-05-15 | Discharge: 2023-05-15 | Disposition: A | Discharge status: Home / Self Care | Attending: Obstetrics & Gynecology | Admitting: Obstetrics & Gynecology

## 2023-05-15 ENCOUNTER — Ambulatory Visit (HOSPITAL_BASED_OUTPATIENT_CLINIC_OR_DEPARTMENT_OTHER): Admitting: Anesthesiology

## 2023-05-15 ENCOUNTER — Encounter (HOSPITAL_COMMUNITY): Payer: Self-pay | Admitting: Obstetrics & Gynecology

## 2023-05-15 ENCOUNTER — Other Ambulatory Visit: Payer: Self-pay

## 2023-05-15 ENCOUNTER — Encounter (HOSPITAL_COMMUNITY)
Admission: RE | Disposition: A | Discharge status: Home / Self Care | Payer: Self-pay | Source: Home / Self Care | Attending: Obstetrics & Gynecology

## 2023-05-15 DIAGNOSIS — R7303 Prediabetes: Secondary | ICD-10-CM | POA: Insufficient documentation

## 2023-05-15 DIAGNOSIS — F17210 Nicotine dependence, cigarettes, uncomplicated: Secondary | ICD-10-CM | POA: Diagnosis not present

## 2023-05-15 DIAGNOSIS — K219 Gastro-esophageal reflux disease without esophagitis: Secondary | ICD-10-CM | POA: Diagnosis not present

## 2023-05-15 DIAGNOSIS — R9389 Abnormal findings on diagnostic imaging of other specified body structures: Secondary | ICD-10-CM | POA: Diagnosis not present

## 2023-05-15 DIAGNOSIS — D250 Submucous leiomyoma of uterus: Secondary | ICD-10-CM

## 2023-05-15 DIAGNOSIS — N950 Postmenopausal bleeding: Secondary | ICD-10-CM

## 2023-05-15 DIAGNOSIS — Z01818 Encounter for other preprocedural examination: Secondary | ICD-10-CM

## 2023-05-15 DIAGNOSIS — N840 Polyp of corpus uteri: Secondary | ICD-10-CM

## 2023-05-15 HISTORY — PX: HYSTEROSCOPY WITH D & C: SHX1775

## 2023-05-15 LAB — RAPID URINE DRUG SCREEN, HOSP PERFORMED
Amphetamines: NOT DETECTED
Barbiturates: NOT DETECTED
Benzodiazepines: NOT DETECTED
Cocaine: NOT DETECTED
Opiates: NOT DETECTED
Tetrahydrocannabinol: NOT DETECTED

## 2023-05-15 LAB — GLUCOSE, CAPILLARY: Glucose-Capillary: 103 mg/dL — ABNORMAL HIGH (ref 70–99)

## 2023-05-15 SURGERY — DILATATION AND CURETTAGE /HYSTEROSCOPY
Anesthesia: General | Site: Uterus

## 2023-05-15 MED ORDER — FENTANYL CITRATE (PF) 100 MCG/2ML IJ SOLN
INTRAMUSCULAR | Status: DC | PRN
  Administered 2023-05-15: 100 ug via INTRAVENOUS

## 2023-05-15 MED ORDER — OXYCODONE HCL 5 MG PO TABS: 5.0000 mg | ORAL_TABLET | Freq: Once | ORAL | Status: DC | PRN

## 2023-05-15 MED ORDER — DEXAMETHASONE SODIUM PHOSPHATE 10 MG/ML IJ SOLN
INTRAMUSCULAR | Status: AC
  Filled 2023-05-15: qty 1

## 2023-05-15 MED ORDER — LACTATED RINGERS IV SOLN: INTRAVENOUS | Status: DC

## 2023-05-15 MED ORDER — KETOROLAC TROMETHAMINE 30 MG/ML IJ SOLN
INTRAMUSCULAR | Status: AC
  Administered 2023-05-15: 30 mg via INTRAVENOUS
  Filled 2023-05-15: qty 1

## 2023-05-15 MED ORDER — ONDANSETRON 8 MG PO TBDP: 8.0000 mg | ORAL_TABLET | Freq: Three times a day (TID) | ORAL | 0 refills | Status: DC | PRN

## 2023-05-15 MED ORDER — MIDAZOLAM HCL 2 MG/2ML IJ SOLN
INTRAMUSCULAR | Status: AC
  Filled 2023-05-15: qty 2

## 2023-05-15 MED ORDER — KETOROLAC TROMETHAMINE 10 MG PO TABS: 10.0000 mg | ORAL_TABLET | Freq: Three times a day (TID) | ORAL | 0 refills | Status: DC | PRN

## 2023-05-15 MED ORDER — POVIDONE-IODINE 10 % EX SWAB
2.0000 | Freq: Once | CUTANEOUS | Status: AC
  Administered 2023-05-15: 2 via TOPICAL

## 2023-05-15 MED ORDER — CEFAZOLIN SODIUM-DEXTROSE 2-3 GM-%(50ML) IV SOLR
INTRAVENOUS | Status: DC | PRN
Start: 2023-05-15 — End: 2023-05-15
  Administered 2023-05-15: 2 g via INTRAVENOUS

## 2023-05-15 MED ORDER — SCOPOLAMINE 1 MG/3DAYS TD PT72
1.0000 | MEDICATED_PATCH | Freq: Once | TRANSDERMAL | Status: DC
  Administered 2023-05-15: 1.5 mg via TRANSDERMAL
  Filled 2023-05-15: qty 1

## 2023-05-15 MED ORDER — CHLORHEXIDINE GLUCONATE 0.12 % MT SOLN
15.0000 mL | Freq: Once | OROMUCOSAL | Status: AC
  Administered 2023-05-15: 15 mL via OROMUCOSAL

## 2023-05-15 MED ORDER — CEFAZOLIN SODIUM-DEXTROSE 2-4 GM/100ML-% IV SOLN
INTRAVENOUS | Status: AC
  Filled 2023-05-15: qty 100

## 2023-05-15 MED ORDER — CEFAZOLIN SODIUM-DEXTROSE 2-4 GM/100ML-% IV SOLN
2.0000 g | INTRAVENOUS | Status: DC
Start: 2023-05-15 — End: 2023-05-15

## 2023-05-15 MED ORDER — PROPOFOL 10 MG/ML IV BOLUS
INTRAVENOUS | Status: AC
  Filled 2023-05-15: qty 20

## 2023-05-15 MED ORDER — FENTANYL CITRATE (PF) 100 MCG/2ML IJ SOLN
INTRAMUSCULAR | Status: AC
  Filled 2023-05-15: qty 2

## 2023-05-15 MED ORDER — ONDANSETRON HCL 4 MG/2ML IJ SOLN
INTRAMUSCULAR | Status: DC | PRN
  Administered 2023-05-15: 4 mg via INTRAVENOUS

## 2023-05-15 MED ORDER — LIDOCAINE 2% (20 MG/ML) 5 ML SYRINGE
INTRAMUSCULAR | Status: AC
  Filled 2023-05-15: qty 5

## 2023-05-15 MED ORDER — ORAL CARE MOUTH RINSE: 15.0000 mL | Freq: Once | OROMUCOSAL | Status: AC

## 2023-05-15 MED ORDER — FENTANYL CITRATE PF 50 MCG/ML IJ SOSY: 25.0000 ug | PREFILLED_SYRINGE | INTRAMUSCULAR | Status: DC | PRN

## 2023-05-15 MED ORDER — DEXAMETHASONE SODIUM PHOSPHATE 10 MG/ML IJ SOLN
INTRAMUSCULAR | Status: DC | PRN
Start: 2023-05-15 — End: 2023-05-15
  Administered 2023-05-15: 10 mg via INTRAVENOUS

## 2023-05-15 MED ORDER — KETOROLAC TROMETHAMINE 30 MG/ML IJ SOLN
30.0000 mg | INTRAMUSCULAR | Status: AC
Start: 2023-05-15 — End: 2023-05-15

## 2023-05-15 MED ORDER — SODIUM CHLORIDE 0.9 % IR SOLN
Status: DC | PRN
  Administered 2023-05-15: 3000 mL

## 2023-05-15 MED ORDER — ONDANSETRON HCL 4 MG/2ML IJ SOLN
INTRAMUSCULAR | Status: AC
  Filled 2023-05-15: qty 2

## 2023-05-15 MED ORDER — SODIUM CHLORIDE 0.9 % IV SOLN: 12.5000 mg | INTRAVENOUS | Status: DC | PRN

## 2023-05-15 MED ORDER — OXYCODONE HCL 5 MG/5ML PO SOLN: 5.0000 mg | Freq: Once | ORAL | Status: DC | PRN

## 2023-05-15 MED ORDER — MIDAZOLAM HCL 2 MG/2ML IJ SOLN
INTRAMUSCULAR | Status: DC | PRN
Start: 2023-05-15 — End: 2023-05-15
  Administered 2023-05-15: 2 mg via INTRAVENOUS

## 2023-05-15 SURGICAL SUPPLY — 25 items
APPLICATOR COTTON TIP 6 STRL (MISCELLANEOUS) ×1 IMPLANT
APPLICATOR COTTON TIP 6IN STRL (MISCELLANEOUS) ×1 IMPLANT
BAG HAMPER (MISCELLANEOUS) ×1 IMPLANT
CLOTH BEACON ORANGE TIMEOUT ST (SAFETY) ×1 IMPLANT
COVER LIGHT HANDLE STERIS (MISCELLANEOUS) ×2 IMPLANT
GAUZE 4X4 16PLY ~~LOC~~+RFID DBL (SPONGE) ×1 IMPLANT
GLOVE BIOGEL PI IND STRL 7.0 (GLOVE) ×2 IMPLANT
GLOVE BIOGEL PI IND STRL 8 (GLOVE) ×1 IMPLANT
GLOVE ECLIPSE 8.0 STRL XLNG CF (GLOVE) ×2 IMPLANT
GOWN STRL REUS W/TWL LRG LVL3 (GOWN DISPOSABLE) ×1 IMPLANT
GOWN STRL REUS W/TWL XL LVL3 (GOWN DISPOSABLE) ×1 IMPLANT
INST SET HYSTEROSCOPY (KITS) ×1 IMPLANT
IV NS 1000ML BAXH (IV SOLUTION) ×1 IMPLANT
KIT TURNOVER CYSTO (KITS) ×1 IMPLANT
MANIFOLD NEPTUNE II (INSTRUMENTS) ×1 IMPLANT
MARKER SKIN DUAL TIP RULER LAB (MISCELLANEOUS) ×1 IMPLANT
NS IRRIG 1000ML POUR BTL (IV SOLUTION) ×1 IMPLANT
PACK SRG BSC III STRL LF ECLPS (CUSTOM PROCEDURE TRAY) ×1 IMPLANT
PAD ARMBOARD POSITIONER FOAM (MISCELLANEOUS) ×1 IMPLANT
PAD TELFA 3X4 1S STER (GAUZE/BANDAGES/DRESSINGS) ×1 IMPLANT
POSITIONER HEAD 8X9X4 ADT (SOFTGOODS) ×1 IMPLANT
SET BASIN LINEN APH (SET/KITS/TRAYS/PACK) ×1 IMPLANT
SET CYSTO W/LG BORE CLAMP LF (SET/KITS/TRAYS/PACK) ×1 IMPLANT
SHEET LAVH (DRAPES) ×1 IMPLANT
TUBE CONNECTING 12X1/4 (SUCTIONS) ×1 IMPLANT

## 2023-05-15 NOTE — Anesthesia Procedure Notes (Signed)
 Procedure Name: LMA Insertion Date/Time: 05/15/2023 11:38 AM  Performed by: Beverely Buba, CRNAPre-anesthesia Checklist: Patient identified, Emergency Drugs available, Suction available, Patient being monitored and Timeout performed Patient Re-evaluated:Patient Re-evaluated prior to induction Oxygen Delivery Method: Circle system utilized Preoxygenation: Pre-oxygenation with 100% oxygen Induction Type: IV induction LMA: LMA inserted LMA Size: 4.0 Number of attempts: 1 Placement Confirmation: positive ETCO2, CO2 detector and breath sounds checked- equal and bilateral Tube secured with: Tape Dental Injury: Teeth and Oropharynx as per pre-operative assessment

## 2023-05-15 NOTE — Transfer of Care (Signed)
 Immediate Anesthesia Transfer of Care Note  Patient: Jasmine Santos  Procedure(s) Performed: DILATATION AND CURETTAGE /HYSTEROSCOPY (Uterus)  Patient Location: PACU  Anesthesia Type:General  Level of Consciousness: awake, alert , oriented, and patient cooperative  Airway & Oxygen Therapy: Patient Spontanous Breathing  Post-op Assessment: Report given to RN, Post -op Vital signs reviewed and stable, and Patient moving all extremities X 4  Post vital signs: Reviewed and stable  Last Vitals:  Vitals Value Taken Time  BP 134/83 05/15/23 1215  Temp 98.7 05/15/2023 1215  Pulse 89 05/15/23 1215  Resp 27 05/15/23 1215  SpO2 95 % 05/15/23 1215  Vitals shown include unfiled device data.  Last Pain:  Vitals:   05/15/23 0814  TempSrc: Oral  PainSc: 0-No pain      Patients Stated Pain Goal: 5 (05/15/23 5621)  Complications: No notable events documented.

## 2023-05-15 NOTE — H&P (Signed)
 Preoperative History and Physical  Jasmine Santos is a 63 y.o. G1P0010 with Patient's last menstrual period was 05/14/2010. admitted for a hysteroscopy D&C for post menopausal bleeding, endometrial stripe 19 mm and office biopsy that showed only inactive endometrium .  With stripe >11 mm needs formal and full evaluation given negative office pathology  PMH:    Past Medical History:  Diagnosis Date   ACL laxity 10/16/2011   Anxiety    Arthritis    Phreesia 12/28/2019   Depression    Follow-up examination    GERD (gastroesophageal reflux disease)    Phreesia 12/28/2019   Hyperlipemia    Knee pain    Old bucket handle tear medial meniscus 10/16/2011   Pre-diabetes     PSH:     Past Surgical History:  Procedure Laterality Date   ANKLE SURGERY     ESOPHAGEAL DILATION  02/01/2015   Procedure: ESOPHAGEAL DILATION;  Surgeon: Ruby Corporal, MD;  Location: [facility];  Service: Endoscopy;;   ESOPHAGOGASTRODUODENOSCOPY N/A 02/01/2015   Procedure: ESOPHAGOGASTRODUODENOSCOPY (EGD);  Surgeon: Ruby Corporal, MD;  Location: [facility];  Service: Endoscopy;  Laterality: N/A;   FRACTURE SURGERY N/A    Phreesia 12/28/2019   KNEE SURGERY     TONSILLECTOMY      POb/GynH:      OB History     Gravida  1   Para      Term      Preterm      AB  1   Living  0      SAB      IAB      Ectopic      Multiple      Live Births              SH:   Social History   Tobacco Use   Smoking status: Every Day    Current packs/day: 0.50    Average packs/day: 0.5 packs/day for 25.0 years (12.5 ttl pk-yrs)    Types: Cigarettes   Smokeless tobacco: Never  Vaping Use   Vaping status: Never Used  Substance Use Topics   Alcohol use: Yes    Comment: At least 4 drinks a day.  Drinks beer.   Drug use: Yes    Types: Cocaine    Comment: 20 days ago last time per patient    FH:    Family History  Problem Relation Age of Onset   Aortic dissection Mother    Cancer Father         Renal Ce   Scleroderma Sister      Allergies: No Known Allergies  Medications:       Current Facility-Administered Medications:    ceFAZolin (ANCEF) 2-4 GM/100ML-% IVPB, , , ,    ceFAZolin (ANCEF) IVPB 2g/100 mL premix, 2 g, Intravenous, On Call to OR, Wendelyn Halter, MD   lactated ringers infusion, , Intravenous, Continuous, Ewell, Charles, MD   scopolamine (TRANSDERM-SCOP) 1 MG/3DAYS 1.5 mg, 1 patch, Transdermal, Once, Araceli Knight, MD, 1.5 mg at 05/15/23 1610  Review of Systems:   Review of Systems  Constitutional: Negative for fever, chills, weight loss, malaise/fatigue and diaphoresis.  HENT: Negative for hearing loss, ear pain, nosebleeds, congestion, sore throat, neck pain, tinnitus and ear discharge.   Eyes: Negative for blurred vision, double vision, photophobia, pain, discharge and redness.  Respiratory: Negative for cough, hemoptysis, sputum production, shortness of breath, wheezing and stridor.   Cardiovascular: Negative for chest pain,  palpitations, orthopnea, claudication, leg swelling and PND.  Gastrointestinal: Positive for abdominal pain. Negative for heartburn, nausea, vomiting, diarrhea, constipation, blood in stool and melena.  Genitourinary: Negative for dysuria, urgency, frequency, hematuria and flank pain.  Musculoskeletal: Negative for myalgias, back pain, joint pain and falls.  Skin: Negative for itching and rash.  Neurological: Negative for dizziness, tingling, tremors, sensory change, speech change, focal weakness, seizures, loss of consciousness, weakness and headaches.  Endo/Heme/Allergies: Negative for environmental allergies and polydipsia. Does not bruise/bleed easily.  Psychiatric/Behavioral: Negative for depression, suicidal ideas, hallucinations, memory loss and substance abuse. The patient is not nervous/anxious and does not have insomnia.      PHYSICAL EXAM:  Blood pressure (!) 140/98, pulse 64, temperature 98.4 F (36.9 C), temperature  source Oral, resp. rate 18, height 5\' 4"  (1.626 m), weight 77.1 kg, last menstrual period 05/14/2010, SpO2 96%.    Vitals reviewed. Constitutional: She is oriented to person, place, and time. She appears well-developed and well-nourished.  HENT:  Head: Normocephalic and atraumatic.  Right Ear: External ear normal.  Left Ear: External ear normal.  Nose: Nose normal.  Mouth/Throat: Oropharynx is clear and moist.  Eyes: Conjunctivae and EOM are normal. Pupils are equal, round, and reactive to light. Right eye exhibits no discharge. Left eye exhibits no discharge. No scleral icterus.  Neck: Normal range of motion. Neck supple. No tracheal deviation present. No thyromegaly present.  Cardiovascular: Normal rate, regular rhythm, normal heart sounds and intact distal pulses.  Exam reveals no gallop and no friction rub.   No murmur heard. Respiratory: Effort normal and breath sounds normal. No respiratory distress. She has no wheezes. She has no rales. She exhibits no tenderness.  GI: Soft. Bowel sounds are normal. She exhibits no distension and no mass. There is tenderness. There is no rebound and no guarding.  Genitourinary:       Vulva is normal without lesions Vagina is pink moist without discharge Cervix normal in appearance and pap is normal Uterus is normal size, contour, position, consistency, mobility, non-tender Adnexa is negative with normal sized ovaries by sonogram  Musculoskeletal: Normal range of motion. She exhibits no edema and no tenderness.  Neurological: She is alert and oriented to person, place, and time. She has normal reflexes. She displays normal reflexes. No cranial nerve deficit. She exhibits normal muscle tone. Coordination normal.  Skin: Skin is warm and dry. No rash noted. No erythema. No pallor.  Psychiatric: She has a normal mood and affect. Her behavior is normal. Judgment and thought content normal.    Labs: Results for orders placed or performed during the  hospital encounter of 05/15/23 (from the past 2 weeks)  Rapid Urine Drug Screen (hospital performed - Not at [facility])   Collection Time: 05/15/23  7:56 AM  Result Value Ref Range   Opiates NONE DETECTED NONE DETECTED   Cocaine NONE DETECTED NONE DETECTED   Benzodiazepines NONE DETECTED NONE DETECTED   Amphetamines NONE DETECTED NONE DETECTED   Tetrahydrocannabinol NONE DETECTED NONE DETECTED   Barbiturates NONE DETECTED NONE DETECTED  Glucose, capillary   Collection Time: 05/15/23  8:16 AM  Result Value Ref Range   Glucose-Capillary 103 (H) 70 - 99 mg/dL  Results for orders placed or performed during the hospital encounter of 05/13/23 (from the past 2 weeks)  Hemoglobin A1c   Collection Time: 05/13/23  3:18 PM  Result Value Ref Range   Hgb A1c MFr Bld 5.6 4.8 - 5.6 %   Mean Plasma Glucose 114.02 mg/dL  CBC   Collection Time: 05/13/23  3:18 PM  Result Value Ref Range   WBC 9.5 4.0 - 10.5 K/uL   RBC 4.99 3.87 - 5.11 MIL/uL   Hemoglobin 15.4 (H) 12.0 - 15.0 g/dL   HCT 09.8 11.9 - 14.7 %   MCV 89.8 80.0 - 100.0 fL   MCH 30.9 26.0 - 34.0 pg   MCHC 34.4 30.0 - 36.0 g/dL   RDW 82.9 56.2 - 13.0 %   Platelets 253 150 - 400 K/uL   nRBC 0.0 0.0 - 0.2 %  Comprehensive metabolic panel   Collection Time: 05/13/23  3:18 PM  Result Value Ref Range   Sodium 136 135 - 145 mmol/L   Potassium 3.8 3.5 - 5.1 mmol/L   Chloride 102 98 - 111 mmol/L   CO2 22 22 - 32 mmol/L   Glucose, Bld 106 (H) 70 - 99 mg/dL   BUN 14 8 - 23 mg/dL   Creatinine, Ser 8.65 0.44 - 1.00 mg/dL   Calcium 9.3 8.9 - 78.4 mg/dL   Total Protein 7.0 6.5 - 8.1 g/dL   Albumin 4.0 3.5 - 5.0 g/dL   AST 21 15 - 41 U/L   ALT 21 0 - 44 U/L   Alkaline Phosphatase 79 38 - 126 U/L   Total Bilirubin 0.6 0.0 - 1.2 mg/dL   GFR, Estimated >69 >62 mL/min   Anion gap 12 5 - 15    EKG: Orders placed or performed during the hospital encounter of 03/11/23   EKG 12-LEAD   EKG 12-LEAD    Imaging Studies: No results  found.    Assessment: Post menopausal bleeding 19 mm endometrium  Plan: Hysteroscopy D&C  Wendelyn Halter 05/15/2023 10:57 AM

## 2023-05-15 NOTE — Anesthesia Postprocedure Evaluation (Signed)
 Anesthesia Post Note  Patient: Jasmine Santos  Procedure(s) Performed: DILATATION AND CURETTAGE /HYSTEROSCOPY (Uterus)  Patient location during evaluation: PACU Anesthesia Type: General Level of consciousness: awake and alert Pain management: pain level controlled Vital Signs Assessment: post-procedure vital signs reviewed and stable Respiratory status: spontaneous breathing, nonlabored ventilation, respiratory function stable and patient connected to nasal cannula oxygen Cardiovascular status: blood pressure returned to baseline and stable Postop Assessment: no apparent nausea or vomiting Anesthetic complications: no   There were no known notable events for this encounter.   Last Vitals:  Vitals:   05/15/23 1230 05/15/23 1251  BP: 126/83 126/83  Pulse: 75 75  Resp: (!) 26   Temp:  (!) 36.4 C  SpO2: 90% 94%    Last Pain:  Vitals:   05/15/23 1251  TempSrc: Oral  PainSc: 0-No pain                 Adalbert Alberto L Chevy Sweigert

## 2023-05-15 NOTE — Op Note (Signed)
 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  An office endometrial biopsy was performed and revealed a benign endometrium, but since it was so thick needed a more thorough evaluation.   This morning the patient had 3 relatively large polyps one larger than the others and a submucosal myoma. The endometrium itself appeared to be normal.   Description of operation: Patient was taken to the operating room and placed in the supine position where she underwent laryngeal mask airway anesthesia. She was placed in the dorsal lithotomy position.  She was prepped and draped in the usual sterile fashion.  A Graves speculum was placed.  The cervix was grasped with a single-tooth tenaculum.  The cervix was dilated serially using Hegar dilators to allow passage of the hysteroscope.  The hysteroscope was then placed into the endometrial cavity without difficulty and the above noted findings were seen.  Randall stone forceps ring forceps and the uterine curet were used to remove the 3 polyps and a submucosal myoma  The endometrium was thoroughly curetted with good uterine cry in all areas  Hysteroscopic reevaluation confirmed the removal of all endometrial pathology  There was good hemostasis with only suspected minor endometrial using  The patient tolerated the procedure well  She experienced minimal blood loss  She was taken to the recovery room in good  stable condition  All counts were correct x3  She received 2 g of Ancef and 30 mg of Toradol preoperatively  She will be followed up in the office next week for postoperative visit and to review the pathology which I expect to be benign  Wendelyn Halter, MD 05/15/2023 12:09 PM

## 2023-05-15 NOTE — Anesthesia Preprocedure Evaluation (Signed)
 Anesthesia Evaluation  Patient identified by MRN, date of birth, ID band Patient awake    Reviewed: Allergy & Precautions, H&P , NPO status , Patient's Chart, lab work & pertinent test results, reviewed documented beta blocker date and time   Airway Mallampati: II  TM Distance: >3 FB Neck ROM: full    Dental no notable dental hx. (+) Dental Advisory Given, Teeth Intact   Pulmonary neg pulmonary ROS, Current Smoker   Pulmonary exam normal breath sounds clear to auscultation       Cardiovascular Exercise Tolerance: Good negative cardio ROS Normal cardiovascular exam Rhythm:regular Rate:Normal     Neuro/Psych  PSYCHIATRIC DISORDERS Anxiety Depression    negative neurological ROS     GI/Hepatic Neg liver ROS,GERD  ,,  Endo/Other  prediabetes  Renal/GU negative Renal ROS  negative genitourinary   Musculoskeletal  (+) Arthritis , Osteoarthritis,    Abdominal   Peds  Hematology negative hematology ROS (+)   Anesthesia Other Findings   Reproductive/Obstetrics negative OB ROS                             Anesthesia Physical Anesthesia Plan  ASA: 2  Anesthesia Plan: General   Post-op Pain Management:    Induction: Intravenous  PONV Risk Score and Plan: Ondansetron , Dexamethasone, Midazolam  and Scopolamine patch - Pre-op  Airway Management Planned: LMA  Additional Equipment: None  Intra-op Plan:   Post-operative Plan: Extubation in OR  Informed Consent: I have reviewed the patients History and Physical, chart, labs and discussed the procedure including the risks, benefits and alternatives for the proposed anesthesia with the patient or authorized representative who has indicated his/her understanding and acceptance.     Dental Advisory Given  Plan Discussed with: CRNA  Anesthesia Plan Comments:         Anesthesia Quick Evaluation

## 2023-05-16 ENCOUNTER — Encounter (HOSPITAL_COMMUNITY): Payer: Self-pay | Admitting: Obstetrics & Gynecology

## 2023-05-16 LAB — SURGICAL PATHOLOGY

## 2023-05-23 ENCOUNTER — Encounter: Admitting: Obstetrics & Gynecology

## 2023-05-27 ENCOUNTER — Ambulatory Visit: Admitting: Internal Medicine

## 2023-06-04 ENCOUNTER — Encounter: Admitting: Obstetrics & Gynecology

## 2023-06-05 ENCOUNTER — Encounter: Payer: Self-pay | Admitting: Internal Medicine

## 2023-06-06 ENCOUNTER — Other Ambulatory Visit: Payer: Self-pay

## 2023-06-06 DIAGNOSIS — F411 Generalized anxiety disorder: Secondary | ICD-10-CM

## 2023-06-06 MED ORDER — HYDROXYZINE PAMOATE 25 MG PO CAPS: 25.0000 mg | ORAL_CAPSULE | Freq: Two times a day (BID) | ORAL | 0 refills | Status: DC | PRN

## 2023-06-13 ENCOUNTER — Telehealth (INDEPENDENT_AMBULATORY_CARE_PROVIDER_SITE_OTHER): Admitting: Obstetrics & Gynecology

## 2023-06-13 ENCOUNTER — Encounter: Payer: Self-pay | Admitting: Obstetrics & Gynecology

## 2023-06-13 DIAGNOSIS — Z4889 Encounter for other specified surgical aftercare: Secondary | ICD-10-CM | POA: Diagnosis not present

## 2023-06-13 DIAGNOSIS — Z9889 Other specified postprocedural states: Secondary | ICD-10-CM

## 2023-06-13 NOTE — Progress Notes (Signed)
 My Chart audio visit:  Video would not work Pt is at home I am in my office Total time 10 minutes   HPI: Patient returns for routine postoperative follow-up having undergone hysteroscopy D&C on 04/3023.  The patient's immediate postoperative recovery has been unremarkable. Since hospital discharge the patient reports no problems.   Current Outpatient Medications: B Complex-C (SUPER B COMPLEX PO), Take 1 tablet by mouth daily., Disp: , Rfl:  hydrOXYzine  (VISTARIL ) 25 MG capsule, Take 1 capsule (25 mg total) by mouth 2 (two) times daily as needed for anxiety., Disp: 180 capsule, Rfl: 0 melatonin 5 MG TABS, Take 10 mg by mouth at bedtime., Disp: , Rfl:  pravastatin  (PRAVACHOL ) 40 MG tablet, Take 1 tablet (40 mg total) by mouth daily., Disp: 30 tablet, Rfl: 0 QUEtiapine  (SEROQUEL ) 50 MG tablet, TAKE 1 TABLET(50 MG) BY MOUTH AT BEDTIME, Disp: 90 tablet, Rfl: 0 albuterol  (VENTOLIN  HFA) 108 (90 Base) MCG/ACT inhaler, INHALE 2 PUFFS INTO THE LUNGS EVERY 6 HOURS AS NEEDED FOR WHEEZING OR SHORTNESS OF BREATH (Patient not taking: Reported on 06/13/2023), Disp: 6.7 g, Rfl: 0 fexofenadine (ALLEGRA) 180 MG tablet, Take 180 mg by mouth daily as needed for allergies or rhinitis. (Patient not taking: Reported on 06/13/2023), Disp: , Rfl:  fluticasone  (FLONASE ) 50 MCG/ACT nasal spray, Place 2 sprays into both nostrils daily as needed for allergies or rhinitis. (Patient not taking: Reported on 06/13/2023), Disp: , Rfl:  ibuprofen  (ADVIL ) 600 MG tablet, Take 1 tablet (600 mg total) by mouth every 8 (eight) hours as needed. (Patient not taking: Reported on 05/07/2023), Disp: 30 tablet, Rfl: 0 naproxen  sodium (ALEVE ) 220 MG tablet, Take 440 mg by mouth 2 (two) times daily as needed (pain). (Patient not taking: Reported on 06/13/2023), Disp: , Rfl:   No current facility-administered medications for this visit.    Last menstrual period 05/14/2010.  Physical Exam: na  Diagnostic  Tests:   Pathology: benign  Impression + Management plan:   ICD-10-CM   1. Post-operative state: S/P hysteroscopy D&C 4/30-->3 benign polyps and 1 benign fibroid, all benign  Z98.890           Medications Prescribed this encounter: No orders of the defined types were placed in this encounter.     Follow up: prn   Wendelyn Halter, MD Attending Physician for the Center for [facility] and [facility] 06/13/2023 5:25 PM

## 2023-06-20 ENCOUNTER — Encounter: Payer: Self-pay | Admitting: Internal Medicine

## 2023-06-24 ENCOUNTER — Other Ambulatory Visit: Payer: Self-pay

## 2023-06-24 DIAGNOSIS — F331 Major depressive disorder, recurrent, moderate: Secondary | ICD-10-CM

## 2023-06-24 MED ORDER — QUETIAPINE FUMARATE 50 MG PO TABS
50.0000 mg | ORAL_TABLET | Freq: Every day | ORAL | 0 refills | Status: DC
Start: 2023-06-24 — End: 2023-07-25

## 2023-07-12 ENCOUNTER — Encounter: Payer: Self-pay | Admitting: Internal Medicine

## 2023-07-15 ENCOUNTER — Other Ambulatory Visit: Payer: Self-pay | Admitting: Internal Medicine

## 2023-07-15 DIAGNOSIS — E559 Vitamin D deficiency, unspecified: Secondary | ICD-10-CM

## 2023-07-15 MED ORDER — VITAMIN D (ERGOCALCIFEROL) 1.25 MG (50000 UNIT) PO CAPS: 50000.0000 [IU] | ORAL_CAPSULE | ORAL | 1 refills | Status: AC

## 2023-07-17 ENCOUNTER — Ambulatory Visit: Payer: Self-pay | Admitting: Internal Medicine

## 2023-07-23 ENCOUNTER — Encounter: Payer: Self-pay | Admitting: Internal Medicine

## 2023-07-24 ENCOUNTER — Other Ambulatory Visit: Payer: Self-pay | Admitting: Internal Medicine

## 2023-07-24 ENCOUNTER — Encounter: Payer: Self-pay | Admitting: Internal Medicine

## 2023-07-24 DIAGNOSIS — F331 Major depressive disorder, recurrent, moderate: Secondary | ICD-10-CM

## 2023-08-18 ENCOUNTER — Other Ambulatory Visit: Payer: Self-pay | Admitting: Internal Medicine

## 2023-08-18 DIAGNOSIS — F411 Generalized anxiety disorder: Secondary | ICD-10-CM

## 2023-08-25 ENCOUNTER — Other Ambulatory Visit: Payer: Self-pay | Admitting: Internal Medicine

## 2023-08-25 DIAGNOSIS — F331 Major depressive disorder, recurrent, moderate: Secondary | ICD-10-CM

## 2023-09-10 ENCOUNTER — Ambulatory Visit (INDEPENDENT_AMBULATORY_CARE_PROVIDER_SITE_OTHER): Admitting: Internal Medicine

## 2023-09-10 ENCOUNTER — Encounter: Payer: Self-pay | Admitting: Internal Medicine

## 2023-09-10 VITALS — BP 110/72 | HR 82 | Ht 64.0 in | Wt 169.8 lb

## 2023-09-10 DIAGNOSIS — F411 Generalized anxiety disorder: Secondary | ICD-10-CM

## 2023-09-10 DIAGNOSIS — E782 Mixed hyperlipidemia: Secondary | ICD-10-CM | POA: Diagnosis not present

## 2023-09-10 DIAGNOSIS — F1024 Alcohol dependence with alcohol-induced mood disorder: Secondary | ICD-10-CM

## 2023-09-10 DIAGNOSIS — R7303 Prediabetes: Secondary | ICD-10-CM

## 2023-09-10 DIAGNOSIS — E559 Vitamin D deficiency, unspecified: Secondary | ICD-10-CM

## 2023-09-10 DIAGNOSIS — F331 Major depressive disorder, recurrent, moderate: Secondary | ICD-10-CM | POA: Diagnosis not present

## 2023-09-10 MED ORDER — PRAVASTATIN SODIUM 40 MG PO TABS: 40.0000 mg | ORAL_TABLET | Freq: Every day | ORAL | 1 refills | Status: AC

## 2023-09-10 MED ORDER — QUETIAPINE FUMARATE 50 MG PO TABS: 50.0000 mg | ORAL_TABLET | Freq: Every day | ORAL | 4 refills | Status: AC
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
## 2023-09-10 NOTE — Patient Instructions (Signed)
 Please continue to take medications as prescribed.  Please try to cut down/avoid alcohol intake.  Please continue to follow low salt diet and perform moderate exercise/walking as tolerated.

## 2023-09-10 NOTE — Assessment & Plan Note (Signed)
 Reports taking about 3 drinks/day and more on weekends, has cut down from 6 drinks/day Her insomnia, depression and chronic fatigue likely due to alcohol abuse Advised to cut down alcohol intake, patient expressed understanding. Offered medication and support group referral (AA) in the past, she denies.

## 2023-09-11 ENCOUNTER — Ambulatory Visit: Payer: Self-pay | Admitting: Internal Medicine

## 2023-09-11 LAB — CMP14+EGFR
ALT: 19 IU/L (ref 0–32)
AST: 21 IU/L (ref 0–40)
Albumin: 4.6 g/dL (ref 3.9–4.9)
Alkaline Phosphatase: 95 IU/L (ref 44–121)
BUN/Creatinine Ratio: 14 (ref 12–28)
BUN: 10 mg/dL (ref 8–27)
Bilirubin Total: 0.7 mg/dL (ref 0.0–1.2)
CO2: 24 mmol/L (ref 20–29)
Calcium: 9.9 mg/dL (ref 8.7–10.3)
Chloride: 101 mmol/L (ref 96–106)
Creatinine, Ser: 0.74 mg/dL (ref 0.57–1.00)
Globulin, Total: 2.5 g/dL (ref 1.5–4.5)
Glucose: 81 mg/dL (ref 70–99)
Potassium: 4.2 mmol/L (ref 3.5–5.2)
Sodium: 141 mmol/L (ref 134–144)
Total Protein: 7.1 g/dL (ref 6.0–8.5)
eGFR: 90 mL/min/1.73 (ref >59)

## 2023-09-11 LAB — CBC WITH DIFFERENTIAL/PLATELET
Basophils Absolute: 0.1 x10E3/uL (ref 0.0–0.2)
Basos: 1 %
EOS (ABSOLUTE): 0.3 x10E3/uL (ref 0.0–0.4)
Eos: 4 %
Hematocrit: 48.7 % — ABNORMAL HIGH (ref 34.0–46.6)
Hemoglobin: 16 g/dL — ABNORMAL HIGH (ref 11.1–15.9)
Immature Grans (Abs): 0 x10E3/uL (ref 0.0–0.1)
Immature Granulocytes: 0 %
Lymphocytes Absolute: 3 x10E3/uL (ref 0.7–3.1)
Lymphs: 37 %
MCH: 31.1 pg (ref 26.6–33.0)
MCHC: 32.9 g/dL (ref 31.5–35.7)
MCV: 95 fL (ref 79–97)
Monocytes Absolute: 0.9 x10E3/uL (ref 0.1–0.9)
Monocytes: 11 %
Neutrophils Absolute: 3.9 x10E3/uL (ref 1.4–7.0)
Neutrophils: 47 %
Platelets: 291 x10E3/uL (ref 150–450)
RBC: 5.15 x10E6/uL (ref 3.77–5.28)
RDW: 12.9 % (ref 11.7–15.4)
WBC: 8.3 x10E3/uL (ref 3.4–10.8)

## 2023-09-11 LAB — HEMOGLOBIN A1C
Est. average glucose Bld gHb Est-mCnc: 117 mg/dL
Hgb A1c MFr Bld: 5.7 % — ABNORMAL HIGH (ref 4.8–5.6)

## 2023-09-11 LAB — VITAMIN D 25 HYDROXY (VIT D DEFICIENCY, FRACTURES): Vit D, 25-Hydroxy: 37.4 ng/mL (ref 30.0–100.0)

## 2023-09-11 LAB — LIPID PANEL
Chol/HDL Ratio: 3.6 ratio (ref 0.0–4.4)
Cholesterol, Total: 227 mg/dL — ABNORMAL HIGH (ref 100–199)
HDL: 63 mg/dL (ref >39)
LDL Chol Calc (NIH): 122 mg/dL — ABNORMAL HIGH (ref 0–99)
Triglycerides: 239 mg/dL — ABNORMAL HIGH (ref 0–149)
VLDL Cholesterol Cal: 42 mg/dL — ABNORMAL HIGH (ref 5–40)

## 2023-09-11 LAB — TSH: TSH: 2.04 u[IU]/mL (ref 0.450–4.500)
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
## 2023-09-11 NOTE — Assessment & Plan Note (Signed)
 Lab Results  Component Value Date   HGBA1C 5.7 (H) 09/10/2023   Advised to follow low carb diet

## 2023-09-11 NOTE — Assessment & Plan Note (Signed)
On Vitamin D 50,000 IU qw 

## 2023-09-11 NOTE — Assessment & Plan Note (Signed)
 Lipid profile reviewed Advised to follow low cholesterol diet for now On Pravastatin  40 mg QD

## 2023-09-11 NOTE — Assessment & Plan Note (Signed)
Refilled Vistaril PRN for anxiety On Seroquel for resistant depression

## 2023-09-25 ENCOUNTER — Encounter: Payer: Self-pay | Admitting: Internal Medicine

## 2023-09-25 ENCOUNTER — Other Ambulatory Visit: Payer: Self-pay | Admitting: Internal Medicine

## 2023-09-25 DIAGNOSIS — J0141 Acute recurrent pansinusitis: Secondary | ICD-10-CM

## 2023-09-25 MED ORDER — AMOXICILLIN-POT CLAVULANATE 875-125 MG PO TABS: 1.0000 | ORAL_TABLET | Freq: Two times a day (BID) | ORAL | 0 refills | Status: AC

## 2023-10-16 ENCOUNTER — Other Ambulatory Visit: Payer: Self-pay | Admitting: Internal Medicine

## 2023-10-16 DIAGNOSIS — F411 Generalized anxiety disorder: Secondary | ICD-10-CM

## 2023-10-17 ENCOUNTER — Other Ambulatory Visit (HOSPITAL_COMMUNITY): Payer: Self-pay

## 2023-10-17 ENCOUNTER — Encounter: Payer: Self-pay | Admitting: Internal Medicine

## 2023-10-17 ENCOUNTER — Telehealth: Payer: Self-pay | Admitting: Pharmacy Technician

## 2023-10-17 NOTE — Telephone Encounter (Signed)
 Sent a my chart message.

## 2023-10-17 NOTE — Telephone Encounter (Signed)
 PA request hasnot been started. New Encounter has been or will be created for follow up. For additional info see Pharmacy telephone encounter from 10/02/025.  No PA needed. Current 30 day copay is $0.67.

## 2023-10-17 NOTE — Telephone Encounter (Signed)
 Pharmacy Patient Advocate Encounter   Received notification from Patient Advice Request messages that prior authorization for Quetiapine  50mg  tablets is required/requested.   Insurance verification completed.   The patient is insured through [facility].   Per test claim: The current 30 day co-pay is, $0.67.  No PA needed at this time. This test claim was processed through [facility]- copay amounts may vary at other pharmacies due to pharmacy/plan contracts, or as the patient moves through the different stages of their insurance plan.

## 2024-01-31 ENCOUNTER — Other Ambulatory Visit: Payer: Self-pay | Admitting: Internal Medicine

## 2024-01-31 DIAGNOSIS — F411 Generalized anxiety disorder: Secondary | ICD-10-CM
